# Patient Record
Sex: Female | Born: 1995 | Race: Black or African American | Hispanic: No | Marital: Single | Smoking: Never smoker
Health system: Southern US, Community
[De-identification: ages and names within clinical notes are randomized; demographics above are authoritative.]

## PROBLEM LIST (undated history)

## (undated) ENCOUNTER — Inpatient Hospital Stay (HOSPITAL_COMMUNITY): Payer: Self-pay

## (undated) ENCOUNTER — Emergency Department (HOSPITAL_COMMUNITY)

## (undated) DIAGNOSIS — B009 Herpesviral infection, unspecified: Secondary | ICD-10-CM

## (undated) DIAGNOSIS — O24419 Gestational diabetes mellitus in pregnancy, unspecified control: Secondary | ICD-10-CM

## (undated) HISTORY — PX: WISDOM TOOTH EXTRACTION: SHX21

## (undated) HISTORY — DX: Herpesviral infection, unspecified: B00.9

## (undated) HISTORY — DX: Gestational diabetes mellitus in pregnancy, unspecified control: O24.419

---

## 2016-09-01 ENCOUNTER — Emergency Department (HOSPITAL_COMMUNITY): Payer: Medicare Other

## 2016-09-01 ENCOUNTER — Encounter (HOSPITAL_COMMUNITY): Payer: Self-pay | Admitting: Emergency Medicine

## 2016-09-01 ENCOUNTER — Emergency Department (HOSPITAL_COMMUNITY)
Admission: EM | Admit: 2016-09-01 | Discharge: 2016-09-01 | Disposition: A | Discharge status: Home / Self Care | Payer: Medicare Other | Attending: Emergency Medicine | Admitting: Emergency Medicine

## 2016-09-01 DIAGNOSIS — F17200 Nicotine dependence, unspecified, uncomplicated: Secondary | ICD-10-CM | POA: Insufficient documentation

## 2016-09-01 DIAGNOSIS — R0789 Other chest pain: Secondary | ICD-10-CM | POA: Diagnosis not present

## 2016-09-01 DIAGNOSIS — R072 Precordial pain: Secondary | ICD-10-CM | POA: Diagnosis present

## 2016-09-01 LAB — BASIC METABOLIC PANEL
Anion gap: 9 (ref 5–15)
BUN: 12 mg/dL (ref 6–20)
CO2: 24 mmol/L (ref 22–32)
Calcium: 9.3 mg/dL (ref 8.9–10.3)
Chloride: 105 mmol/L (ref 101–111)
Creatinine, Ser: 0.93 mg/dL (ref 0.44–1.00)
GFR calc Af Amer: >60 mL/min (ref >60)
GFR calc non Af Amer: >60 mL/min (ref >60)
GLUCOSE: 82 mg/dL (ref 65–99)
POTASSIUM: 3.5 mmol/L (ref 3.5–5.1)
Sodium: 138 mmol/L (ref 135–145)

## 2016-09-01 LAB — CBC
HEMATOCRIT: 40.5 % (ref 36.0–46.0)
Hemoglobin: 13.4 g/dL (ref 12.0–15.0)
MCH: 26.7 pg (ref 26.0–34.0)
MCHC: 33.1 g/dL (ref 30.0–36.0)
MCV: 80.8 fL (ref 78.0–100.0)
Platelets: 296 10*3/uL (ref 150–400)
RBC: 5.01 MIL/uL (ref 3.87–5.11)
RDW: 13.3 % (ref 11.5–15.5)
WBC: 11.2 10*3/uL — ABNORMAL HIGH (ref 4.0–10.5)

## 2016-09-01 LAB — I-STAT TROPONIN, ED: Troponin i, poc: 0 ng/mL (ref 0.00–0.08)

## 2016-09-01 MED ORDER — GI COCKTAIL ~~LOC~~
30.0000 mL | Freq: Once | ORAL | Status: AC
  Administered 2016-09-01: 30 mL via ORAL
  Filled 2016-09-01: qty 30

## 2016-09-01 NOTE — ED Notes (Signed)
ED Provider at bedside. 

## 2016-09-01 NOTE — ED Triage Notes (Signed)
Pt states "two days ago I was smoking a black and mild and I was drinking, I threw up because I was intoxicated, and now i've been having chest pain since the other day"

## 2016-09-01 NOTE — ED Provider Notes (Addendum)
[facility] Provider Note   CSN: [id number] Arrival date & time: 09/01/16  1212     History   Chief Complaint Chief Complaint  Patient presents with  . Chest Pain    HPI Yolanda Payne is a 20 y.o. female.  The history is provided by the patient.  Chest Pain   This is a new problem. The current episode started 2 days ago. The problem occurs daily. Progression since onset: intermittent. Associated with: etoh and smoking cigar. The pain is present in the substernal region. The pain is mild. The quality of the pain is described as dull. The pain does not radiate. Associated symptoms include vomiting (2 days ago after etoh use; not since). Pertinent negatives include no abdominal pain, no back pain, no cough, no diaphoresis, no fever, no hemoptysis, no malaise/fatigue, no nausea, no palpitations, no shortness of breath and no sputum production.  Pertinent negatives for past medical history include no CAD, no diabetes, no hyperlipidemia, no hypertension, no MI, no PE and no seizures.  Pertinent negatives for family medical history include: no early MI.    History reviewed. No pertinent past medical history.  There are no active problems to display for this patient.   History reviewed. No pertinent surgical history.  OB History    No data available       Home Medications    Prior to Admission medications   Not on File    Family History No family history on file.  Social History Social History  Substance Use Topics  . Smoking status: Current Some Day Smoker  . Smokeless tobacco: Not on file  . Alcohol use Yes     Allergies   Patient has no known allergies.   Review of Systems Review of Systems  Constitutional: Negative for chills, diaphoresis, fever and malaise/fatigue.  HENT: Negative for ear pain and sore throat.   Eyes: Negative for pain and visual disturbance.  Respiratory: Negative for cough, hemoptysis, sputum production and shortness of  breath.   Cardiovascular: Positive for chest pain. Negative for palpitations.  Gastrointestinal: Positive for vomiting (2 days ago after etoh use; not since). Negative for abdominal pain and nausea.  Genitourinary: Negative for dysuria and hematuria.  Musculoskeletal: Negative for arthralgias and back pain.  Skin: Negative for color change and rash.  Neurological: Negative for seizures and syncope.  All other systems reviewed and are negative.    Physical Exam Updated Vital Signs BP 105/74   Pulse 67   Temp 98.3 F (36.8 C) (Oral)   Resp 23   Ht 5\' 3"  (1.6 m)   Wt 196 lb (88.9 kg)   LMP 08/01/2016 (Approximate)   SpO2 96%   BMI 34.72 kg/m   Physical Exam  Constitutional: She is oriented to person, place, and time. She appears well-developed and well-nourished. No distress.  HENT:  Head: Normocephalic and atraumatic.  Nose: Nose normal.  Eyes: Conjunctivae and EOM are normal. Pupils are equal, round, and reactive to light. Right eye exhibits no discharge. Left eye exhibits no discharge. No scleral icterus.  Neck: Normal range of motion. Neck supple.  Cardiovascular: Normal rate and regular rhythm.  Exam reveals no gallop and no friction rub.   No murmur heard. Pulmonary/Chest: Effort normal and breath sounds normal. No stridor. No respiratory distress. She has no rales.  Abdominal: Soft. She exhibits no distension. There is no tenderness.  Musculoskeletal: She exhibits no edema or tenderness.  Neurological: She is alert and oriented to person, place, and  time.  Skin: Skin is warm and dry. No rash noted. She is not diaphoretic. No erythema.  Psychiatric: She has a normal mood and affect.  Vitals reviewed.    ED Treatments / Results  Labs (all labs ordered are listed, but only abnormal results are displayed) Labs Reviewed  CBC - Abnormal; Notable for the following:       Result Value   WBC 11.2 (*)    All other components within normal limits  BASIC METABOLIC PANEL    I-STAT TROPOININ, ED    EKG  EKG Interpretation  Date/Time:  Saturday September 01 2016 12:36:29 EST Ventricular Rate:  81 PR Interval:  132 QRS Duration: 82 QT Interval:  390 QTC Calculation: 453 R Axis:   61 Text Interpretation:  Normal sinus rhythm with sinus arrhythmia Normal ECG No old tracing to compare Confirmed by [facility] MD, Georgina Krist [id number]) on 09/01/2016 5:33:38 PM       Radiology Dg Chest 2 View  Result Date: 09/01/2016 CLINICAL DATA:  Intermittent central chest pain 2 days. EXAM: CHEST  2 VIEW COMPARISON:  None. FINDINGS: Lungs are clear. Cardiothymic silhouette, bones and soft tissues are within normal. IMPRESSION: No active cardiopulmonary disease. Electronically Signed   By: Elberta Fortisaniel  Boyle M.D.   On: 09/01/2016 13:39    Procedures Procedures (including critical care time)  Medications Ordered in ED Medications  gi cocktail (Maalox,Lidocaine,Donnatal) (30 mLs Oral Given 09/01/16 1748)     Initial Impression / Assessment and Plan / ED Course  I have reviewed the triage vital signs and the nursing notes.  Pertinent labs & imaging results that were available during my care of the patient were reviewed by me and considered in my medical decision making (see chart for details).  Clinical Course as of Sep 02 1903  Sat Sep 01, 2016  1739 Atypical chest pain which is highly inconsistent with ACS. Most likely secondary to esophagitis due to alcohol consumption and emesis. Chest x-ray negative for chest pain. No evidence suggesting esophageal perforation. EKG: Normal sinus rhythm, intervals, axis. No evidence of acute ischemia, arrhythmias, or blocks. Triage labs obtained including troponin grossly reassuring. Do not feel that additional cardiac workup is indicated at this time. Low suspicion for pulmonary embolism. Presentation not classic for aortic dissection.  We'll provide the patient with GI cocktail.  [PC]  1904 Some improvement in pain.  The patient is safe for  discharge with strict return precautions.   [PC]    Clinical Course User Index [PC] Nira ConnPedro Eduardo Jaimee Corum, MD      Final Clinical Impressions(s) / ED Diagnoses   Final diagnoses:  Atypical chest pain   Disposition: Discharge  Condition: Good  I have discussed the results, Dx and Tx plan with the patient who expressed understanding and agree(s) with the plan. Discharge instructions discussed at great length. The patient was given strict return precautions who verbalized understanding of the instructions. No further questions at time of discharge.    New Prescriptions   No medications on file    Follow Up: primary care provider  Schedule an appointment as soon as possible for a visit  As needed      Nira ConnPedro Eduardo Jamonta Goerner, MD 09/01/16 1905    Nira ConnPedro Eduardo Carine Nordgren, MD 09/26/16 1531

## 2016-09-07 ENCOUNTER — Encounter (HOSPITAL_COMMUNITY): Payer: Self-pay | Admitting: Emergency Medicine

## 2016-09-07 ENCOUNTER — Emergency Department (HOSPITAL_COMMUNITY)
Admission: EM | Admit: 2016-09-07 | Discharge: 2016-09-07 | Disposition: A | Discharge status: Home / Self Care | Payer: Medicare Other | Attending: Emergency Medicine | Admitting: Emergency Medicine

## 2016-09-07 DIAGNOSIS — J029 Acute pharyngitis, unspecified: Secondary | ICD-10-CM

## 2016-09-07 DIAGNOSIS — F17200 Nicotine dependence, unspecified, uncomplicated: Secondary | ICD-10-CM | POA: Insufficient documentation

## 2016-09-07 DIAGNOSIS — J069 Acute upper respiratory infection, unspecified: Secondary | ICD-10-CM | POA: Diagnosis not present

## 2016-09-07 LAB — RAPID STREP SCREEN (MED CTR MEBANE ONLY): STREPTOCOCCUS, GROUP A SCREEN (DIRECT): NEGATIVE

## 2016-09-07 MED ORDER — BENZONATATE 100 MG PO CAPS: 100.0000 mg | ORAL_CAPSULE | Freq: Three times a day (TID) | ORAL | 0 refills | Status: DC

## 2016-09-07 MED ORDER — NAPROXEN 500 MG PO TABS: 500.0000 mg | ORAL_TABLET | Freq: Two times a day (BID) | ORAL | 0 refills | Status: DC

## 2016-09-07 MED ORDER — PREDNISONE 20 MG PO TABS
60.0000 mg | ORAL_TABLET | Freq: Once | ORAL | Status: AC
  Administered 2016-09-07: 60 mg via ORAL
  Filled 2016-09-07: qty 3

## 2016-09-07 NOTE — ED Provider Notes (Signed)
[facility] Provider Note   CSN: [id number] Arrival date & time: 09/07/16  0133   By signing my name below, I, Clovis PuAvnee Patel, attest that this documentation has been prepared under the direction and in the presence of Gilda Creasehristopher J Hollee Fate, MD  Electronically Signed: Clovis PuAvnee Patel, ED Scribe. 09/07/16. 2:37 AM.   History   Chief Complaint Chief Complaint  Patient presents with  . Sore Throat   The history is provided by the patient. No language interpreter was used.   HPI Comments:  Yolanda Payne is a 20 y.o. female who presents to the Emergency Department complaining of sudden onset, persistent sore throat x 2 days. Pt also reports nasal congestion, rhinorrhea, sinus pain and a cough. No alleviating factors noted. Pt denies any sick contacts, any other associated symptoms and modifying factors at this time.    History reviewed. No pertinent past medical history.  There are no active problems to display for this patient.   History reviewed. No pertinent surgical history.  OB History    No data available       Home Medications    Prior to Admission medications   Medication Sig Start Date End Date Taking? Authorizing Provider  benzonatate (TESSALON) 100 MG capsule Take 1 capsule (100 mg total) by mouth every 8 (eight) hours. 09/07/16   Gilda Creasehristopher J Kysha Muralles, MD  naproxen (NAPROSYN) 500 MG tablet Take 1 tablet (500 mg total) by mouth 2 (two) times daily. 09/07/16   Gilda Creasehristopher J Berdella Bacot, MD    Family History No family history on file.  Social History Social History  Substance Use Topics  . Smoking status: Current Some Day Smoker  . Smokeless tobacco: Never Used  . Alcohol use Yes     Allergies   Patient has no known allergies.   Review of Systems Review of Systems  HENT: Positive for congestion, rhinorrhea, sinus pain and sore throat.   Respiratory: Positive for cough.   All other systems reviewed and are negative.  Physical Exam Updated  Vital Signs BP 130/71 (BP Location: Left Arm)   Pulse 112   Temp 98.6 F (37 C) (Oral)   Resp 18   Wt 196 lb (88.9 kg)   LMP 08/19/2016 (Approximate)   SpO2 99%   BMI 34.72 kg/m   Physical Exam  Constitutional: She is oriented to person, place, and time. She appears well-developed and well-nourished. No distress.  HENT:  Head: Normocephalic and atraumatic.  Right Ear: Hearing normal.  Left Ear: Hearing normal.  Nose: Nose normal.  Mouth/Throat: Oropharynx is clear and moist and mucous membranes are normal.  Maxillary sinus tenderness   Eyes: Conjunctivae and EOM are normal. Pupils are equal, round, and reactive to light.  Neck: Normal range of motion. Neck supple.  Cardiovascular: Regular rhythm, S1 normal and S2 normal.  Exam reveals no gallop and no friction rub.   No murmur heard. Pulmonary/Chest: Effort normal and breath sounds normal. No respiratory distress. She exhibits no tenderness.  Abdominal: Soft. Normal appearance and bowel sounds are normal. There is no hepatosplenomegaly. There is no tenderness. There is no rebound, no guarding, no tenderness at McBurney's point and negative Murphy's sign. No hernia.  Musculoskeletal: Normal range of motion.  Neurological: She is alert and oriented to person, place, and time. She has normal strength. No cranial nerve deficit or sensory deficit. Coordination normal. GCS eye subscore is 4. GCS verbal subscore is 5. GCS motor subscore is 6.  Skin: Skin is warm, dry and intact. No  rash noted. No cyanosis.  Psychiatric: She has a normal mood and affect. Her speech is normal and behavior is normal. Thought content normal.  Nursing note and vitals reviewed.    ED Treatments / Results  DIAGNOSTIC STUDIES:  Oxygen Saturation is 99% on RA, normal by my interpretation.    COORDINATION OF CARE:  2:35 AM Discussed treatment plan with pt at bedside and pt agreed to plan.  Labs (all labs ordered are listed, but only abnormal results are  displayed) Labs Reviewed  RAPID STRE[facility]EN [facility])  CULTURE, GROUP A STREP [facility])    EKG  EKG Interpretation None       Radiology No results found.  Procedures Procedures (including critical care time)  Medications Ordered in ED Medications  predniSONE (DELTASONE) tablet 60 mg (not administered)     Initial Impression / Assessment and Plan / ED Course  I have reviewed the triage vital signs and the nursing notes.  Pertinent labs & imaging results that were available during my care of the patient were reviewed by me and considered in my medical decision making (see chart for details).  Clinical Course   Sore throat with cold symptoms, negative strep. Treat symptomatically.  Final Clinical Impressions(s) / ED Diagnoses   Final diagnoses:  Viral pharyngitis  Upper respiratory tract infection, unspecified type    New Prescriptions New Prescriptions   BENZONATATE (TESSALON) 100 MG CAPSULE    Take 1 capsule (100 mg total) by mouth every 8 (eight) hours.   NAPROXEN (NAPROSYN) 500 MG TABLET    Take 1 tablet (500 mg total) by mouth 2 (two) times daily.  I personally performed the services described in this documentation, which was scribed in my presence. The recorded information has been reviewed and is accurate.     Gilda Creasehristopher J Reesa Gotschall, MD 09/07/16 [phone]

## 2016-09-07 NOTE — ED Notes (Signed)
Pt presents with multiple complaints, from cold symptoms (runny nose, congestion, non-productive cough, and sore throat) to a "possible yeast infection and pain down there." Pt reports using monistat.

## 2016-09-07 NOTE — ED Triage Notes (Signed)
Patient reports sore throat with swelling , occasional dry cough and nasal congestion onset this week , denies fever or chills.

## 2016-09-07 NOTE — ED Notes (Signed)
Patient verbalized understanding of discharge instructions and denies any further needs or questions at this time. VS stable. Patient ambulatory with steady gait.  

## 2016-09-09 LAB — CULTURE, GROUP A STREP (THRC)

## 2016-10-30 ENCOUNTER — Ambulatory Visit (HOSPITAL_COMMUNITY)
Admission: EM | Admit: 2016-10-30 | Discharge: 2016-10-30 | Disposition: A | Discharge status: Home / Self Care | Payer: Medicare Other | Attending: Internal Medicine | Admitting: Internal Medicine

## 2016-10-30 ENCOUNTER — Encounter (HOSPITAL_COMMUNITY): Payer: Self-pay | Admitting: Family Medicine

## 2016-10-30 DIAGNOSIS — J020 Streptococcal pharyngitis: Secondary | ICD-10-CM

## 2016-10-30 DIAGNOSIS — K115 Sialolithiasis: Secondary | ICD-10-CM | POA: Diagnosis not present

## 2016-10-30 LAB — POCT RAPID STREP A: STREPTOCOCCUS, GROUP A SCREEN (DIRECT): POSITIVE — AB

## 2016-10-30 MED ORDER — PENICILLIN V POTASSIUM 500 MG PO TABS: 500.0000 mg | ORAL_TABLET | Freq: Two times a day (BID) | ORAL | 0 refills | Status: AC

## 2016-10-30 NOTE — ED Triage Notes (Signed)
Pt here for swollen tonsils and white patches.

## 2016-10-30 NOTE — ED Provider Notes (Addendum)
[facility]    CSN: [id number] Arrival date & time: 10/30/16  1501     History   Chief Complaint Chief Complaint  Patient presents with  . Sore Throat    HPI Yolanda Payne is a 21 y.o. female.   Patient complains of a white "stone" in her tonsils that hurts.  She denies fevers, N/V/D.  I has been there x4 days.      History reviewed. No pertinent past medical history.  There are no active problems to display for this patient.   History reviewed. No pertinent surgical history.  OB History    No data available       Home Medications    Prior to Admission medications   Medication Sig Start Date End Date Taking? Authorizing Provider  benzonatate (TESSALON) 100 MG capsule Take 1 capsule (100 mg total) by mouth every 8 (eight) hours. 09/07/16   Gilda Creasehristopher J Pollina, MD  naproxen (NAPROSYN) 500 MG tablet Take 1 tablet (500 mg total) by mouth 2 (two) times daily. 09/07/16   Gilda Creasehristopher J Pollina, MD    Family History History reviewed. No pertinent family history.  Social History Social History  Substance Use Topics  . Smoking status: Current Some Day Smoker  . Smokeless tobacco: Never Used  . Alcohol use Yes     Allergies   Patient has no known allergies.   Review of Systems Review of Systems  Constitutional: Negative for chills and fever.  HENT: Positive for sore throat. Negative for ear pain.   Eyes: Negative for pain and visual disturbance.  Respiratory: Negative for cough and shortness of breath.   Cardiovascular: Negative for chest pain and palpitations.  Gastrointestinal: Negative for abdominal pain and vomiting.  Genitourinary: Negative for dysuria and hematuria.  Musculoskeletal: Negative for arthralgias and back pain.  Skin: Negative for color change and rash.  Neurological: Negative for seizures and syncope.  All other systems reviewed and are negative.    Physical Exam Triage Vital Signs ED Triage Vitals  Enc Vitals  Group     BP 10/30/16 1536 110/64     Pulse Rate 10/30/16 1536 69     Resp 10/30/16 1536 18     Temp 10/30/16 1536 98.5 F (36.9 C)     Temp src --      SpO2 10/30/16 1536 100 %     Weight --      Height --      Head Circumference --      Peak Flow --      Pain Score 10/30/16 1538 10     Pain Loc --      Pain Edu? --      Excl. in GC? --    No data found.   Updated Vital Signs BP 110/64   Pulse 69   Temp 98.5 F (36.9 C)   Resp 18   LMP 10/10/2016   SpO2 100%   Visual Acuity Right Eye Distance:   Left Eye Distance:   Bilateral Distance:    Right Eye Near:   Left Eye Near:    Bilateral Near:     Physical Exam  Constitutional: She is oriented to person, place, and time. She appears well-developed and well-nourished. No distress.  HENT:  Head: Normocephalic and atraumatic.  Mouth/Throat: Oropharynx is clear and moist.  Solid white exudative-appearing substance lodged behind left tonsil  Eyes: Conjunctivae and EOM are normal. Pupils are equal, round, and reactive to light. No scleral icterus.  Neck: Normal range of motion. Neck supple. No JVD present. No tracheal deviation present. No thyromegaly present.  Cardiovascular: Normal rate, regular rhythm and normal heart sounds.  Exam reveals no gallop and no friction rub.   No murmur heard. Pulmonary/Chest: Effort normal and breath sounds normal.  Abdominal: Soft. Bowel sounds are normal. She exhibits no distension. There is no tenderness.  Musculoskeletal: Normal range of motion. She exhibits no edema.  Lymphadenopathy:    She has no cervical adenopathy.  Neurological: She is alert and oriented to person, place, and time. No cranial nerve deficit.  Skin: Skin is warm and dry.  Psychiatric: She has a normal mood and affect. Her behavior is normal. Judgment and thought content normal.  Nursing note and vitals reviewed.    UC Treatments / Results  Labs (all labs ordered are listed, but only abnormal results are  displayed) Labs Reviewed  POCT RAPID STREP A - Abnormal; Notable for the following:       Result Value   Streptococcus, Group A Screen (Direct) POSITIVE (*)    All other components within normal limits    EKG  EKG Interpretation None       Radiology No results found.  Procedures Procedures (including critical care time)  Medications Ordered in UC Medications - No data to display   Initial Impression / Assessment and Plan / UC Course  I have reviewed the triage vital signs and the nursing notes.  Pertinent labs & imaging results that were available during my care of the patient were reviewed by me and considered in my medical decision making (see chart for details).     Attempted to dislodge with moderate success.  No complications.  Advised gargling or using water-pic to dislodge macerated calcified food matter.  May suck on a lemon to stimulate smooth muscle contraction within oropharynx.  Prior to discharge discovered the patient is positive for strep throat.  She has no symptoms of strep infection at this time but she has young siblings who may have been exposed at school. Patient may be a carrier.  Final Clinical Impressions(s) / UC Diagnoses   Final diagnoses:  Sialolith    New Prescriptions New Prescriptions   No medications on file     Arnaldo Natal, MD 10/30/16 1723    Arnaldo Natal, MD 10/30/16 [phone]

## 2016-12-30 ENCOUNTER — Emergency Department (HOSPITAL_COMMUNITY)
Admission: EM | Admit: 2016-12-30 | Discharge: 2016-12-30 | Disposition: A | Discharge status: Home / Self Care | Payer: Medicare Other | Attending: Emergency Medicine | Admitting: Emergency Medicine

## 2016-12-30 ENCOUNTER — Encounter (HOSPITAL_COMMUNITY): Payer: Self-pay | Admitting: *Deleted

## 2016-12-30 DIAGNOSIS — F17200 Nicotine dependence, unspecified, uncomplicated: Secondary | ICD-10-CM | POA: Insufficient documentation

## 2016-12-30 DIAGNOSIS — N390 Urinary tract infection, site not specified: Secondary | ICD-10-CM | POA: Diagnosis not present

## 2016-12-30 DIAGNOSIS — R350 Frequency of micturition: Secondary | ICD-10-CM | POA: Diagnosis present

## 2016-12-30 LAB — URINALYSIS, ROUTINE W REFLEX MICROSCOPIC
BILIRUBIN URINE: NEGATIVE
Bacteria, UA: NONE SEEN
GLUCOSE, UA: 50 mg/dL — AB
KETONES UR: NEGATIVE mg/dL
NITRITE: NEGATIVE
PROTEIN: 100 mg/dL — AB
Specific Gravity, Urine: 1.023 (ref 1.005–1.030)
Squamous Epithelial / HPF: NONE SEEN
pH: 5 (ref 5.0–8.0)

## 2016-12-30 LAB — POC URINE PREG, ED: PREG TEST UR: NEGATIVE

## 2016-12-30 MED ORDER — CEPHALEXIN 500 MG PO CAPS: 500.0000 mg | ORAL_CAPSULE | Freq: Four times a day (QID) | ORAL | 0 refills | Status: DC

## 2016-12-30 MED ORDER — CEPHALEXIN 250 MG PO CAPS
500.0000 mg | ORAL_CAPSULE | Freq: Once | ORAL | Status: AC
  Administered 2016-12-30: 500 mg via ORAL
  Filled 2016-12-30: qty 2

## 2016-12-30 NOTE — ED Triage Notes (Signed)
Pt reports lower abd pressure and burning pain when she urinates. Having urinary frequency this morning. Denies vaginal discharge.

## 2016-12-30 NOTE — Discharge Instructions (Signed)
You have a urinary tract infection. Increase fluids. Prescription for antibiotic. Recommend urination after sexual activity.

## 2017-01-01 NOTE — ED Provider Notes (Signed)
[facility]   CSN: [id number] Arrival date & time: 12/30/16  1224     History   Chief Complaint Chief Complaint  Patient presents with  . Urinary Frequency    HPI Yolanda Payne is a 21 y.o. female.  Patient presents with suprapubic discomfort, dysuria, frequency, voiding small amounts. No fever, sweats, chills, flank pain, vaginal discharge. She is normally healthy. She is sexually active. No previous pregnancies. She uses condoms.      History reviewed. No pertinent past medical history.  There are no active problems to display for this patient.   History reviewed. No pertinent surgical history.  OB History    No data available       Home Medications    Prior to Admission medications   Medication Sig Start Date End Date Taking? Authorizing Provider  ibuprofen (ADVIL,MOTRIN) 200 MG tablet Take 200 mg by mouth every 6 (six) hours as needed for headache (pain).   Yes Historical Provider, MD  Phenazopyridine HCl (AZO-STANDARD PO) Take 1 tablet by mouth once. For urinary tract pain   Yes Historical Provider, MD  benzonatate (TESSALON) 100 MG capsule Take 1 capsule (100 mg total) by mouth every 8 (eight) hours. Patient not taking: Reported on 12/30/2016 09/07/16   Gilda Crease, MD  cephALEXin (KEFLEX) 500 MG capsule Take 1 capsule (500 mg total) by mouth 4 (four) times daily. 12/30/16   Donnetta Hutching, MD  naproxen (NAPROSYN) 500 MG tablet Take 1 tablet (500 mg total) by mouth 2 (two) times daily. Patient not taking: Reported on 12/30/2016 09/07/16   Gilda Crease, MD    Family History History reviewed. No pertinent family history.  Social History Social History  Substance Use Topics  . Smoking status: Current Some Day Smoker  . Smokeless tobacco: Never Used  . Alcohol use Yes     Allergies   Patient has no known allergies.   Review of Systems Review of Systems  All other systems reviewed and are  negative.    Physical Exam Updated Vital Signs BP 105/69 (BP Location: Right Arm)   Pulse 70   Temp 98.4 F (36.9 C) (Oral)   Resp 14   SpO2 99%   Physical Exam  Constitutional: She is oriented to person, place, and time. She appears well-developed and well-nourished.  HENT:  Head: Normocephalic and atraumatic.  Eyes: Conjunctivae are normal.  Neck: Neck supple.  Cardiovascular: Normal rate and regular rhythm.   Pulmonary/Chest: Effort normal and breath sounds normal.  Abdominal:  Minimal suprapubic tenderness.  Musculoskeletal: Normal range of motion.  Neurological: She is alert and oriented to person, place, and time.  Skin: Skin is warm and dry.  Psychiatric: She has a normal mood and affect. Her behavior is normal.  Nursing note and vitals reviewed.    ED Treatments / Results  Labs (all labs ordered are listed, but only abnormal results are displayed) Labs Reviewed  URINALYSIS, ROUTINE W REFLEX MICROSCOPIC - Abnormal; Notable for the following:       Result Value   Color, Urine AMBER (*)    APPearance TURBID (*)    Glucose, UA 50 (*)    Hgb urine dipstick LARGE (*)    Protein, ur 100 (*)    Leukocytes, UA MODERATE (*)    All other components within normal limits  POC URINE PREG, ED    EKG  EKG Interpretation None       Radiology No results found.  Procedures Procedures (including critical  care time)  Medications Ordered in ED Medications  cephALEXin (KEFLEX) capsule 500 mg (500 mg Oral Given 12/30/16 1442)     Initial Impression / Assessment and Plan / ED Course  I have reviewed the triage vital signs and the nursing notes.  Pertinent labs & imaging results that were available during my care of the patient were reviewed by me and considered in my medical decision making (see chart for details).     Patient is stable. History of physical most consistent with uncomplicated urinary tract infection. Will Rx Keflex.  Final Clinical  Impressions(s) / ED Diagnoses   Final diagnoses:  Urinary tract infection without hematuria, site unspecified    New Prescriptions Discharge Medication List as of 12/30/2016  4:19 PM    START taking these medications   Details  cephALEXin (KEFLEX) 500 MG capsule Take 1 capsule (500 mg total) by mouth 4 (four) times daily., Starting Sun 12/30/2016, Print         Donnetta Hutching, MD 01/01/17 1248

## 2017-02-25 ENCOUNTER — Encounter (HOSPITAL_COMMUNITY): Payer: Self-pay

## 2017-02-25 ENCOUNTER — Emergency Department (HOSPITAL_COMMUNITY)
Admission: EM | Admit: 2017-02-25 | Discharge: 2017-02-25 | Disposition: A | Discharge status: Home / Self Care | Payer: Medicare Other | Attending: Emergency Medicine | Admitting: Emergency Medicine

## 2017-02-25 DIAGNOSIS — Z5321 Procedure and treatment not carried out due to patient leaving prior to being seen by health care provider: Secondary | ICD-10-CM | POA: Insufficient documentation

## 2017-02-25 DIAGNOSIS — N938 Other specified abnormal uterine and vaginal bleeding: Secondary | ICD-10-CM | POA: Diagnosis not present

## 2017-02-25 LAB — URINALYSIS, ROUTINE W REFLEX MICROSCOPIC
Bilirubin Urine: NEGATIVE
GLUCOSE, UA: NEGATIVE mg/dL
Ketones, ur: NEGATIVE mg/dL
Leukocytes, UA: NEGATIVE
Nitrite: NEGATIVE
Protein, ur: 30 mg/dL — AB
SPECIFIC GRAVITY, URINE: 1.027 (ref 1.005–1.030)
pH: 5 (ref 5.0–8.0)

## 2017-02-25 LAB — POC URINE PREG, ED: Preg Test, Ur: NEGATIVE

## 2017-02-25 NOTE — ED Notes (Signed)
Patient called with no response.

## 2017-02-25 NOTE — ED Triage Notes (Signed)
Pt reports she started her menstrual cycle yesterday and felt like she was going to pass out. She reports heavy bleeding and lower abdominal cramping.

## 2018-05-22 ENCOUNTER — Other Ambulatory Visit: Payer: Self-pay

## 2018-05-22 ENCOUNTER — Ambulatory Visit (HOSPITAL_BASED_OUTPATIENT_CLINIC_OR_DEPARTMENT_OTHER): Payer: Medicare Other | Admitting: Family Medicine

## 2018-05-22 ENCOUNTER — Encounter: Payer: Self-pay | Admitting: Family Medicine

## 2018-05-22 ENCOUNTER — Other Ambulatory Visit (HOSPITAL_COMMUNITY)
Admission: RE | Admit: 2018-05-22 | Discharge: 2018-05-22 | Disposition: A | Discharge status: Home / Self Care | Payer: Medicare Other | Source: Ambulatory Visit | Attending: Family Medicine | Admitting: Family Medicine

## 2018-05-22 VITALS — BP 116/73 | HR 74 | Temp 98.1°F | Resp 18 | Ht 63.0 in | Wt 206.8 lb

## 2018-05-22 DIAGNOSIS — Z202 Contact with and (suspected) exposure to infections with a predominantly sexual mode of transmission: Secondary | ICD-10-CM

## 2018-05-22 DIAGNOSIS — Z8619 Personal history of other infectious and parasitic diseases: Secondary | ICD-10-CM

## 2018-05-22 DIAGNOSIS — N898 Other specified noninflammatory disorders of vagina: Secondary | ICD-10-CM | POA: Insufficient documentation

## 2018-05-22 DIAGNOSIS — R5383 Other fatigue: Secondary | ICD-10-CM

## 2018-05-22 NOTE — Progress Notes (Signed)
Flu shot: no Pain : yeast infection  Std testing  Patient is currently on her period

## 2018-05-22 NOTE — Patient Instructions (Signed)
Preventing Sexually Transmitted Infections, Adult Sexually transmitted infections (STIs) are diseases that are passed (transmitted) from person to person through bodily fluids exchanged during sex or sexual contact. Bodily fluids include saliva, semen, blood, vaginal mucus, and urine. You may have an increased risk for developing an STI if you have unprotected oral, vaginal, or anal sex. Some common STIs include:  Herpes.  Hepatitis B.  Chlamydia.  Gonorrhea.  Syphilis.  HPV (human papillomavirus).  HIV (humanimmunodeficiency virus), the virus that can cause AIDS (acquired immunodeficiency virus).  How can I protect myself from sexually transmitted infections? The only way to completely prevent STIs is not to have sex of any kind (practice abstinence). This includes oral, vaginal, or anal sex. If you are sexually active, take these actions to lower your risk of getting an STI:  Have only one sex partner (be monogamous) or limit the number of sexual partners you have.  Stay up-to-date on immunizations. Certain vaccines can lower your risk of getting certain STIs, such as: ? Hepatitis A and B vaccines. You may have been vaccinated as a young child, but likely need a booster shot as a teen or young adult. ? HPV vaccine. This vaccine is recommended if you are a man under age 22 or a woman under age 27.  Use methods that prevent the exchange of body fluids between partners (barrier protection) every time you have sex. Barrier protection can be used during oral, vaginal, or anal sex. Commonly used barrier methods include: ? Female condom. ? Female condom. ? Dental dam.  Get tested regularly for STIs. Have your sexual partner get tested regularly as well.  Avoid mixing alcohol, drugs, and sex. Alcohol and drug use can affect your ability to make good decisions and can lead to risky sexual behaviors.  Ask your health care provider about taking pre-exposure prophylaxis (PrEP) to prevent HIV  infection if you: ? Have a HIV-positive sexual partner. ? Have multiple sexual partners or partners who do not know their HIV status, and do not regularly use a condom during sex. ? Use injection drugs and share needles.  Birth control pills, injections, implants, and intrauterine devices (IUDs) do not protect against STIs. To prevent both STIs and pregnancy, always use a condom with another form of birth control. Some STIs, such as herpes, are spread through skin to skin contact. A condom does not protect you from getting such STIs. If you or your partner have herpes and there is an active flare with open sores, avoid all sexual contact. Why are these changes important? Taking steps to practice safe sex protects you and others. Many STIs can be cured. However, some STIs are not curable and will affect you for the rest of your life. STIs can be passed on to another person even if you do not have symptoms. What can happen if changes are not made? Certain STIs may:  Require you to take medicine for the rest of your life.  Affect your ability to have children (your fertility).  Increase your risk for developing another STI or certain serious health conditions, such as: ? Cervical cancer. ? Head and neck cancer. ? Pelvic inflammatory disease (PID) in women. ? Organ damage or damage to other parts of your body, if the infection spreads.  Be passed to a baby during childbirth.  How are sexually transmitted infections treated? If you or your partner know or think that you may have an STI:  Talk with your healthcare provider about what can be   done to treat it. Some STIs can be treated and cured with medicines.  For curable STIs, you and your partner should avoid sex during treatment and for several days after treatment is complete.  You and your partner should both be treated at the same time, if there is any chance that your partner is infected as well. If you get treatment but your partner  does not, your partner can re-infect you when you resume sexual contact.  Do not have unprotected sex.  Where to find more information: Learn more about sexually transmitted diseases and infections from:  Centers for Disease Control and Prevention: ? More information about specific STIs: www.cdc.gov/std ? Find places to get sexual health counseling and treatment for free or for a low cost: gettested.cdc.gov  [place] Department of Health and Human Services: www.womenshealth.gov/publications/our-publications/fact-sheet/sexually-transmitted-infections.html  Summary  The only way to completely prevent STIs is not to have sex (practice abstinence), including oral, vaginal, or anal sex.  STIs can spread through saliva, semen, blood, vaginal mucus, urine, or sexual contact.  If you do have sex, limit your number of sexual partners and use a barrier protection method every time you have sex.  If you develop an STI, get treated right away and ask your partner to be treated as well. Do not resume having sex until both of you have completed treatment for the STI. This information is not intended to replace advice given to you by your health care provider. Make sure you discuss any questions you have with your health care provider. Document Released: 08/23/2016 Document Revised: 08/23/2016 Document Reviewed: 08/23/2016 Elsevier Interactive Patient Education  2018 [employer] Inc[employer]  Safe Sex Practicing safe sex means taking steps before and during sex to reduce your risk of:  Getting an STD (sexually transmitted disease).  Giving your partner an STD.  Unwanted pregnancy.  How can I practice safe sex?  To practice safe sex:  Limit your sexual partners to only one partner who is having sex with only you.  Avoid using alcohol and recreational drugs before having sex. These substances can affect your judgment.  Before having sex with a new partner: ? Talk to your partner about past partners,  past STDs, and drug use. ? You and your partner should be screened for STDs and discuss the results with each other.  Check your body regularly for sores, blisters, rashes, or unusual discharge. If you notice any of these problems, visit your health care provider.  If you have symptoms of an infection or you are being treated for an STD, avoid sexual contact.  While having sex, use a condom. Make sure to: ? Use a condom every time you have vaginal, oral, or anal sex. Both females and males should wear condoms during oral sex. ? Keep condoms in place from the beginning to the end of sexual activity. ? Use a latex condom, if possible. Latex condoms offer the best protection. ? Use only water-based lubricants or oils to lubricate a condom. Using petroleum-based lubricants or oils will weaken the condom and increase the chance that it will break.  See your health care provider for regular screenings, exams, and tests for STDs.  Talk with your health care provider about the form of birth control (contraception) that is best for you.  Get vaccinated against hepatitis B and human papillomavirus (HPV).  If you are at risk of being infected with HIV (human immunodeficiency virus), talk with your health care provider about taking a prescription medicine to prevent HIV   infection. You are considered at risk for HIV if: ? You are a man who has sex with other men. ? You are a heterosexual man or woman who is sexually active with more than one partner. ? You take drugs by injection. ? You are sexually active with a partner who has HIV.  This information is not intended to replace advice given to you by your health care provider. Make sure you discuss any questions you have with your health care provider. Document Released: 10/04/2004 Document Revised: 01/11/2016 Document Reviewed: 07/17/2015 Elsevier Interactive Patient Education  2018 Elsevier Inc.  

## 2018-05-23 LAB — HIV ANTIBODY (ROUTINE TESTING W REFLEX): HIV Screen 4th Generation wRfx: NONREACTIVE

## 2018-05-23 LAB — URINE CYTOLOGY ANCILLARY ONLY
Chlamydia: NEGATIVE
Neisseria Gonorrhea: NEGATIVE
Trichomonas: NEGATIVE

## 2018-05-23 LAB — SYPHILIS: RPR W/REFLEX TO RPR TITER AND TREPONEMAL ANTIBODIES, TRADITIONAL SCREENING AND DIAGNOSIS ALGORITHM: RPR Ser Ql: NONREACTIVE

## 2018-05-24 NOTE — Progress Notes (Signed)
Subjective:    Patient ID: Yolanda Payne, female    DOB: 06/07/1996, 22 y.o.   MRN: [id number]  HPI 22 year old female new to the practice.  Patient states that she feels as if she has having some vaginal irritation off and on.  Patient is not sure about vaginal discharge that she is currently on her period which started on May 18, 2018.  Patient reports history of infection with gonorrhea and chlamydia for which she has been treated in the past patient has also asked trichomonal infection which is been treated.  Patient reports lower abdominal/pelvic discomfort but she is not sure if this is related to her current menses.  Patient has had some mild fatigue.  Patient denies any burning with urination.  No urinary frequency.  No fever or chills.  No nausea and no increased back pain.  Patient reports no significant past medical history previous treatment for STIs.  Patient has family history significant for maternal grandmother with diabetes and asthma.  Patient is currently single, currently unemployed.  Patient denies any tobacco or alcohol use.  Patient denies any past surgeries.    Review of Systems  Constitutional: Positive for fatigue. Negative for chills and fever.  Respiratory: Negative for cough and shortness of breath.   Cardiovascular: Negative for chest pain, palpitations and leg swelling.  Gastrointestinal: Positive for abdominal pain (Patient with some crampy lower abdominal pain). Negative for nausea.  Genitourinary: Positive for vaginal bleeding (Currently on her menses). Negative for dysuria and frequency.  Musculoskeletal: Negative for back pain and joint swelling.  Neurological: Negative for dizziness and headaches.       Objective:   Physical Exam  BP 116/73   Pulse 74   Temp 98.1 F (36.7 C) (Oral)   Resp 18   Ht 5\' 3"  (1.6 m)   Wt 206 lb 12.8 oz (93.8 kg)   LMP 05/18/2018 (Exact Date)   SpO2 99%   BMI 36.63 kg/m  Vital signs and nurse's note  reviewed General-well-nourished, well-developed young adult female Lungs-clear to auscultation bilaterally. Cardiovascular-regular rate and rhythm Abdomen-soft, nontender. Back-no CVA tenderness Extremities-no edema     Assessment & Plan:  1. Vaginal irritation Patient with complaint of vaginal irritation, she is unsure if she has had vaginal discharge and she is also currently having her menses.  Patient does have past medical history significant for gonorrhea, chlamydia and trichomonas.  Patient has had recent unprotected sex.  Patient wishes to defer pelvic exam due to her current menses.  Patient will have urine test for STIs and will have blood test for HIV and syphilis at today's visit.  Patient will be notified of the results and if any further treatment is needed based on these results. - Urine cytology ancillary only - HIV antibody (with reflex) - RPR  2. Fatigue, unspecified type Patient will with complaint of fatigue in addition to unprotected sexual practices with possible exposure to STIs.  Patient will be testing at today's visit along with testing for syphilis - HIV antibody (with reflex)  3. H/O gonorrhea Patient with history of gonorrhea, chlamydia and trichomonas.  Patient will have urine test for possible STIs - HIV antibody (with reflex) -Urine cytology ancillary only  4. Exposure to STD Patient with possible exposure to STDs/STIs due to unprotected sexual activity/high risk sexual behavior.  Patient will have testing for HIV, syphilis through blood testing and will have urine test for other STIs including gonorrhea, chlamydia, trichomonas and bacterial vaginitis.  Agricultural engineer as  part of AVS was  given on safe sexual practices and reducing sexually transmitted diseases  - HIV antibody (with reflex) -Urine cytology ancillary only  An After Visit Summary was printed and given to the patient.  Return for 2-3 weeks if continued symptoms.

## 2018-05-26 ENCOUNTER — Telehealth: Payer: Self-pay

## 2018-05-26 NOTE — Telephone Encounter (Signed)
-----   Message from Cain Saupeammie Fulp, MD sent at 05/24/2018  4:46 PM EDT ----- Please notify patient that urine testing was negative for gonorrhea, chlamydia and trichomonas

## 2018-05-26 NOTE — Telephone Encounter (Signed)
Patient was called, answered, verified dob and was given most recent lab results. Patient verbalized understanding and had no further questions.  

## 2018-05-27 ENCOUNTER — Other Ambulatory Visit: Payer: Self-pay | Admitting: Family Medicine

## 2018-05-27 DIAGNOSIS — B9689 Other specified bacterial agents as the cause of diseases classified elsewhere: Secondary | ICD-10-CM

## 2018-05-27 DIAGNOSIS — N760 Acute vaginitis: Principal | ICD-10-CM

## 2018-05-27 LAB — URINE CYTOLOGY ANCILLARY ONLY
Bacterial vaginitis: POSITIVE — AB
Candida vaginitis: NEGATIVE

## 2018-05-27 MED ORDER — METRONIDAZOLE 500 MG PO TABS: 500.0000 mg | ORAL_TABLET | Freq: Two times a day (BID) | ORAL | 0 refills | Status: AC

## 2018-05-27 NOTE — Progress Notes (Signed)
Patient with abnormal urine cytology report consistent with bacterial vaginitis and new prescription sent to patient's pharmacy for metronidazole. CMA will notify patient of the results and medication order.

## 2018-05-28 ENCOUNTER — Telehealth: Payer: Self-pay

## 2018-05-28 NOTE — Telephone Encounter (Signed)
-----   Message from Cain Saupeammie Fulp, MD sent at 05/27/2018  5:09 PM EDT ----- Patient with bacterial vaginitis- overgrowth of normal bacteria in the vaginal area. A prescription will be sent to her pharmacy for metronidazole 500 mg twice daily for 7 days to treat this.

## 2018-05-28 NOTE — Telephone Encounter (Signed)
Pt called to request a nurse call back, in regards to her new lab results. Please follow up

## 2018-05-28 NOTE — Telephone Encounter (Signed)
Patient was called, answered, verified dob, and was given most recent lab results. Patient verbalized understanding and had no further questions at this time.

## 2018-06-09 ENCOUNTER — Ambulatory Visit: Payer: Medicare Other | Admitting: Family Medicine

## 2018-10-22 IMAGING — DX DG CHEST 2V
2 series · 2 of 2 positions shown · non-contrast
Comparison: None.

CLINICAL DATA: Intermittent central chest pain 2 days.

EXAM:
CHEST  2 VIEW

[chest pa]
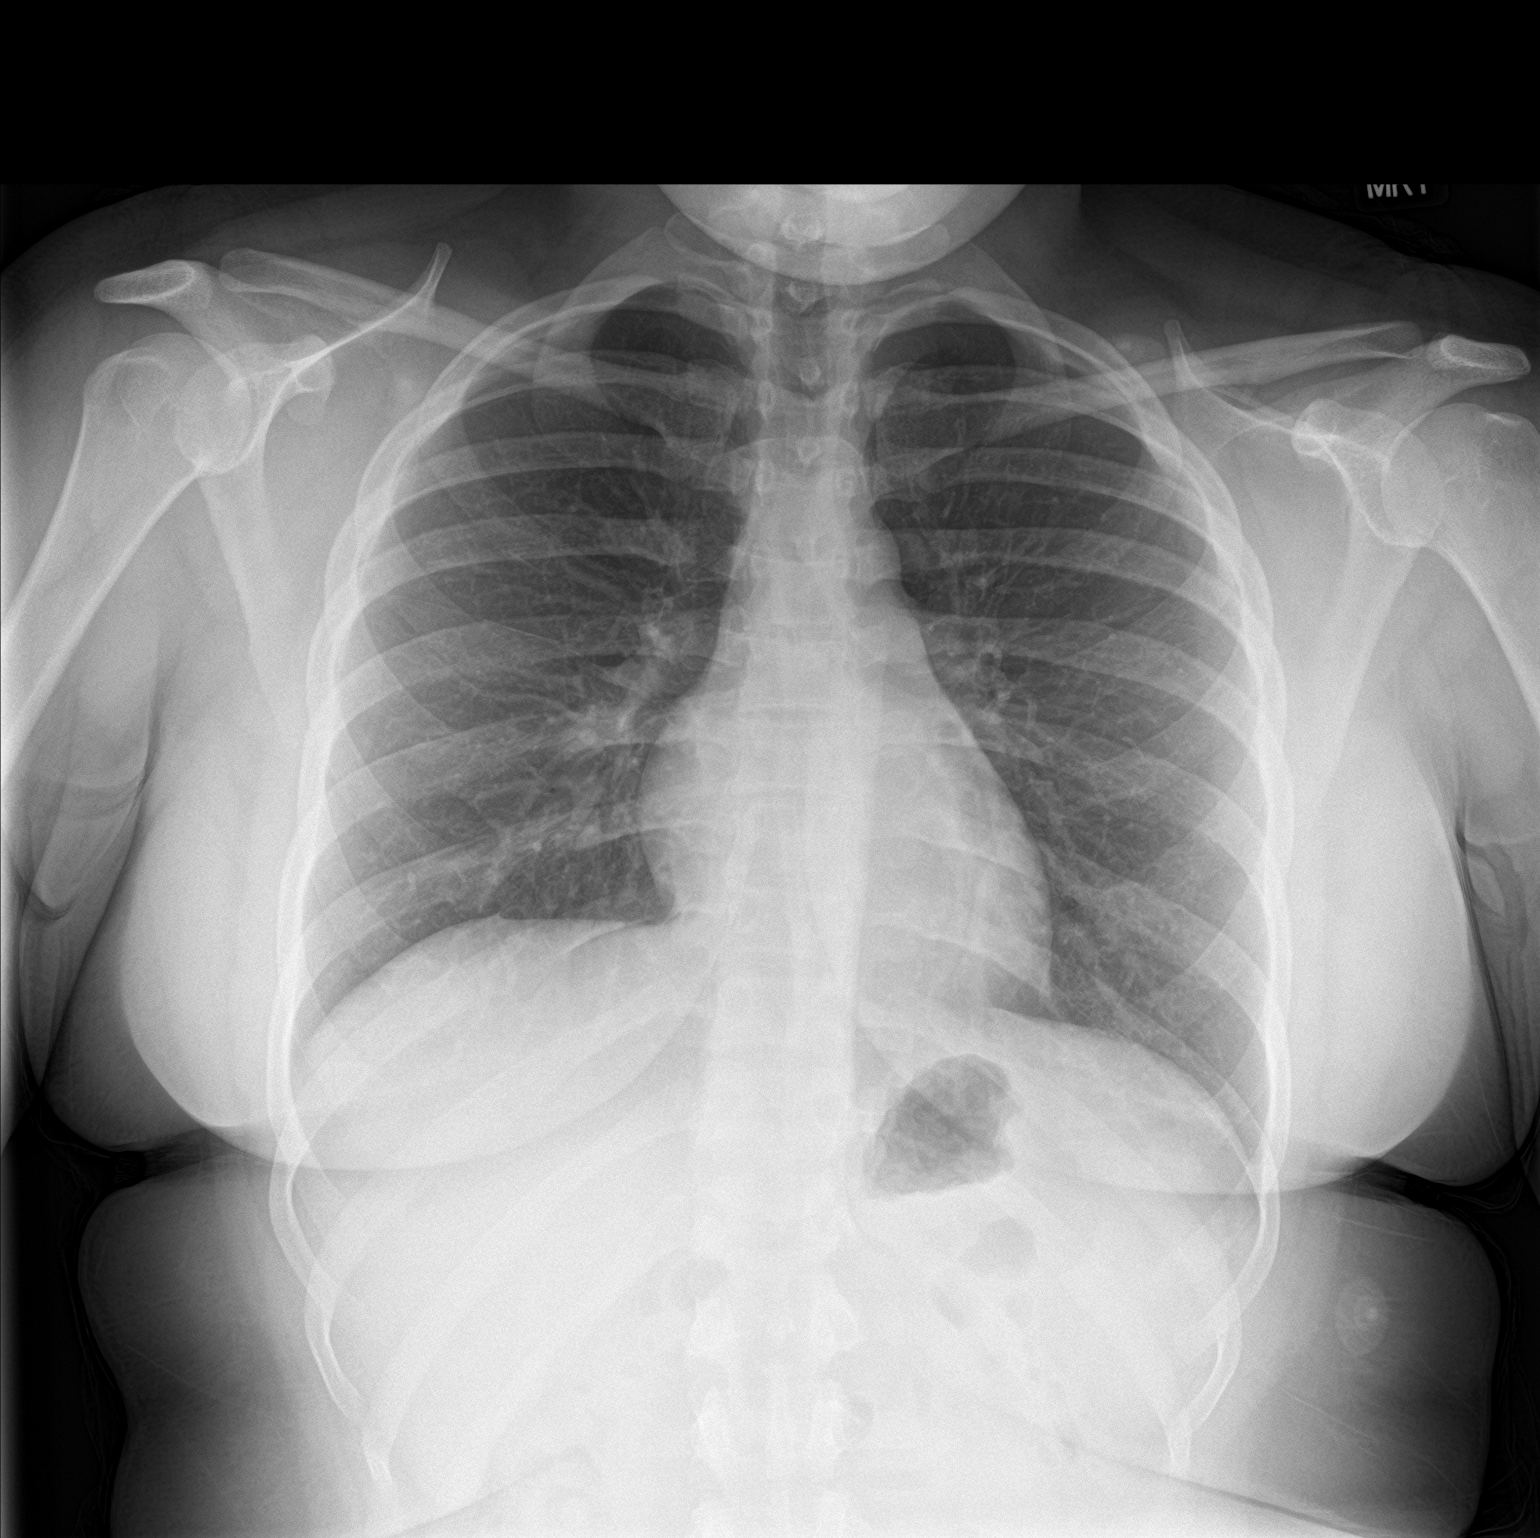

[chest lat]
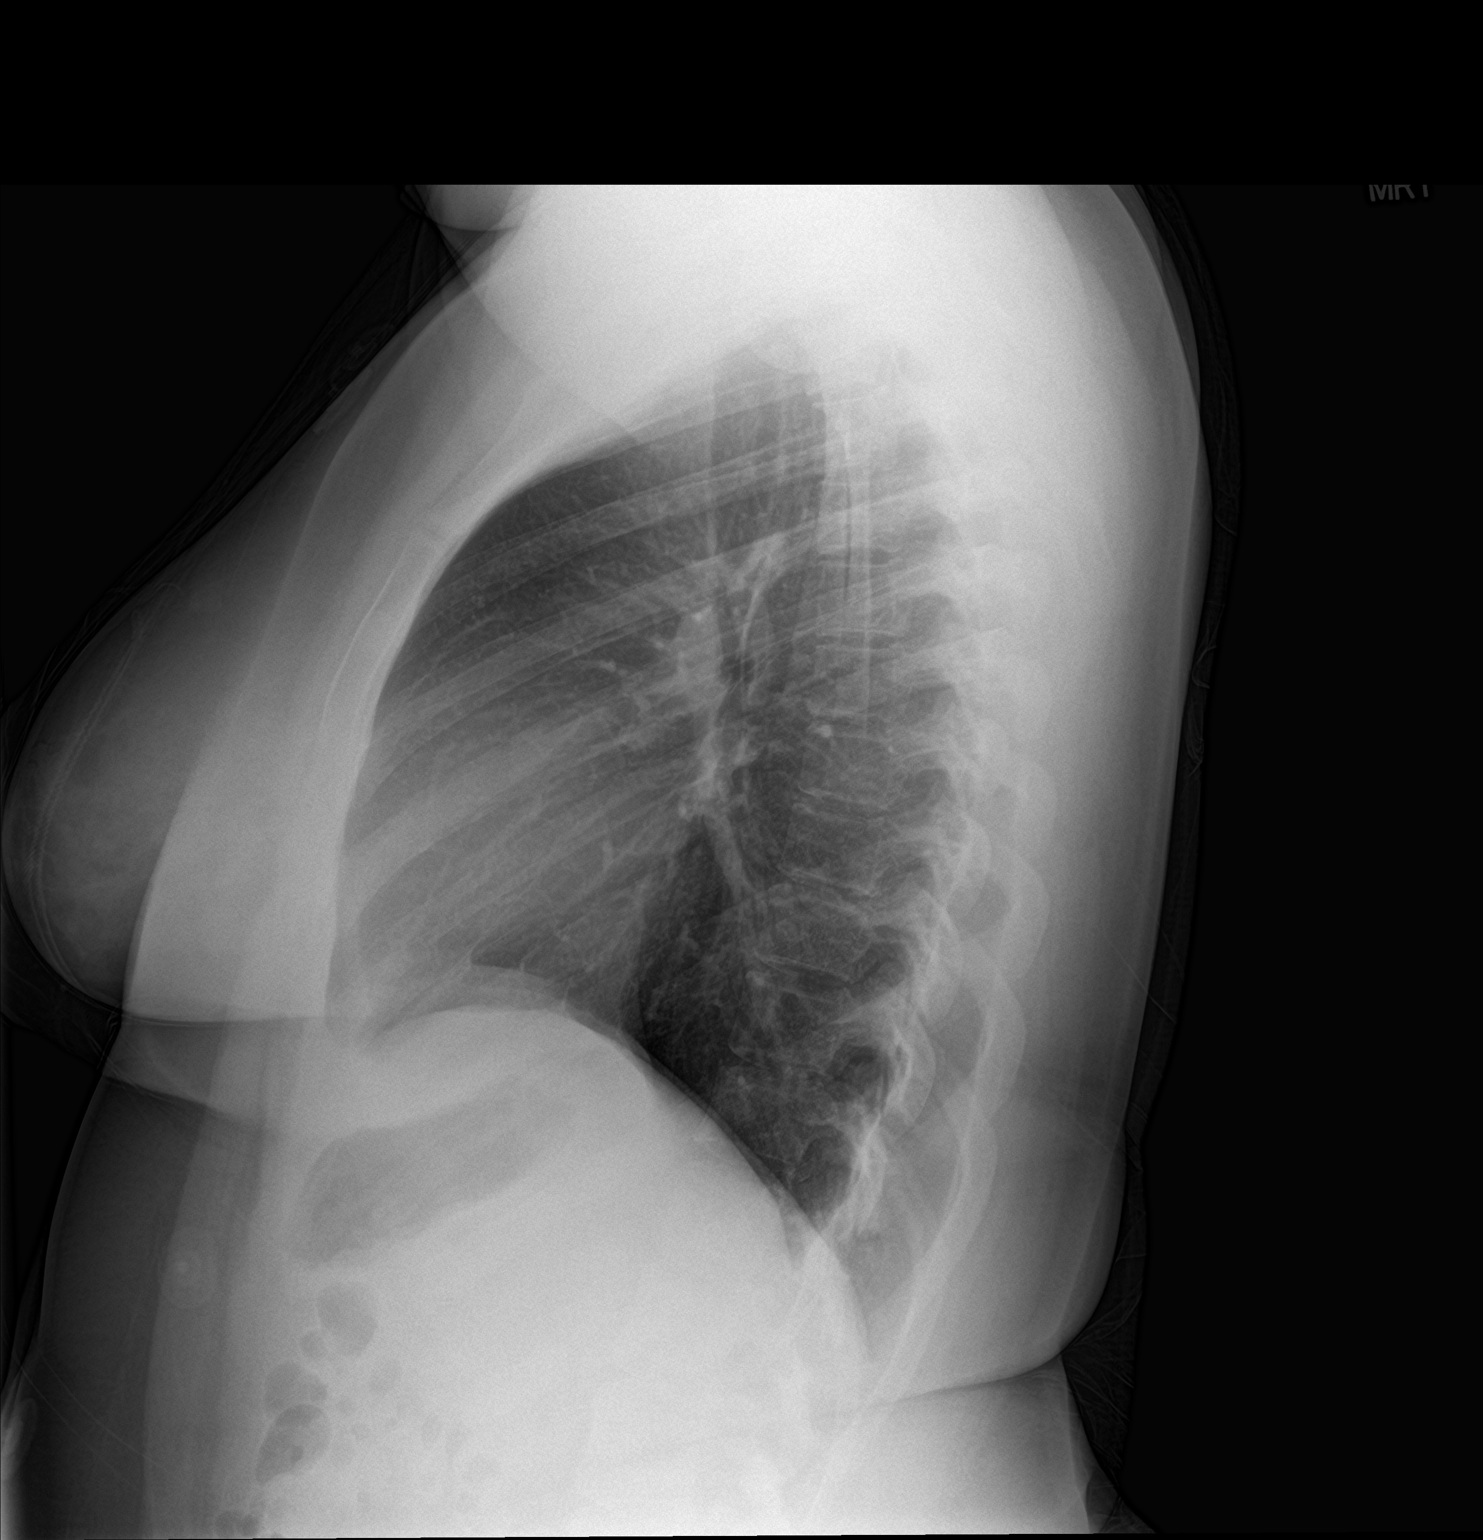

[2 of 2 positions shown; findings below may reference images not displayed]

FINDINGS: Lungs are clear. Cardiothymic silhouette, bones and soft tissues are
within normal.
IMPRESSION: No active cardiopulmonary disease.

## 2020-04-21 ENCOUNTER — Encounter: Payer: Self-pay | Admitting: Family Medicine

## 2020-04-21 ENCOUNTER — Ambulatory Visit (HOSPITAL_COMMUNITY)
Admission: EM | Admit: 2020-04-21 | Discharge: 2020-04-21 | Disposition: A | Discharge status: Home / Self Care | Payer: Medicare Other | Attending: Family Medicine | Admitting: Family Medicine

## 2020-04-21 ENCOUNTER — Other Ambulatory Visit: Payer: Self-pay

## 2020-04-21 ENCOUNTER — Encounter (HOSPITAL_COMMUNITY): Payer: Self-pay | Admitting: Emergency Medicine

## 2020-04-21 DIAGNOSIS — Z87891 Personal history of nicotine dependence: Secondary | ICD-10-CM | POA: Insufficient documentation

## 2020-04-21 DIAGNOSIS — Z3202 Encounter for pregnancy test, result negative: Secondary | ICD-10-CM | POA: Diagnosis not present

## 2020-04-21 DIAGNOSIS — N898 Other specified noninflammatory disorders of vagina: Secondary | ICD-10-CM | POA: Insufficient documentation

## 2020-04-21 DIAGNOSIS — N760 Acute vaginitis: Secondary | ICD-10-CM

## 2020-04-21 DIAGNOSIS — R109 Unspecified abdominal pain: Secondary | ICD-10-CM | POA: Diagnosis not present

## 2020-04-21 LAB — POCT URINALYSIS DIPSTICK, ED / UC
Bilirubin Urine: NEGATIVE
Glucose, UA: NEGATIVE mg/dL
Hgb urine dipstick: NEGATIVE
Nitrite: NEGATIVE
Protein, ur: 30 mg/dL — AB
Specific Gravity, Urine: 1.025 (ref 1.005–1.030)
Urobilinogen, UA: 0.2 mg/dL (ref 0.0–1.0)
pH: 6.5 (ref 5.0–8.0)

## 2020-04-21 LAB — POC URINE PREG, ED: Preg Test, Ur: NEGATIVE

## 2020-04-21 MED ORDER — FLUCONAZOLE 150 MG PO TABS
150.0000 mg | ORAL_TABLET | Freq: Every day | ORAL | 0 refills | Status: DC
Start: 2020-04-21 — End: 2020-06-15

## 2020-04-21 MED ORDER — METRONIDAZOLE 500 MG PO TABS
500.0000 mg | ORAL_TABLET | Freq: Two times a day (BID) | ORAL | 0 refills | Status: DC
Start: 2020-04-21 — End: 2020-06-15

## 2020-04-21 NOTE — ED Triage Notes (Signed)
Pt presents with abdominal pain, vaginal white discharge with odor, and itching, xs 1-2 days.  Denies dysuria, blood in urine, N, V,D, fever,  or back pain. Pt denies taking any OTC medication for symptoms. Pt denies any chance of being pregnant.

## 2020-04-21 NOTE — ED Provider Notes (Signed)
[facility]    CSN: [id number] Arrival date & time: 04/21/20  1003      History   Chief Complaint Chief Complaint  Patient presents with  . Vaginitis    HPI Yolanda Payne is a 24 y.o. female.   Patient is a 24 year old female who presents today with approximate 1 to 2 days of abdominal cramping, white vaginal discharge with odor and itching.  Symptoms have been constant.  History of BV and yeast and feels similar.  Reporting recently used different soap.  Denies dysuria, hematuria, nausea, vomiting or diarrhea.  Has not taken anything for her symptoms.Patient's last menstrual period was 03/18/2020 (approximate).      History reviewed. No pertinent past medical history.  There are no problems to display for this patient.   History reviewed. No pertinent surgical history.  OB History   No obstetric history on file.      Home Medications    Prior to Admission medications   Medication Sig Start Date End Date Taking? Authorizing Provider  fluconazole (DIFLUCAN) 150 MG tablet Take 1 tablet (150 mg total) by mouth daily. 04/21/20   Dahlia Byes A, NP  ibuprofen (ADVIL,MOTRIN) 200 MG tablet Take 200 mg by mouth every 6 (six) hours as needed for headache (pain).    [provider]  metroNIDAZOLE (FLAGYL) 500 MG tablet Take 1 tablet (500 mg total) by mouth 2 (two) times daily. 04/21/20   Dahlia Byes A, NP  Phenazopyridine HCl (AZO-STANDARD PO) Take 1 tablet by mouth once. For urinary tract pain    [provider]    Family History Family History  Problem Relation Age of Onset  . Healthy Mother   . Healthy Father     Social History Social History   Tobacco Use  . Smoking status: Former Games developer  . Smokeless tobacco: Never Used  Substance Use Topics  . Alcohol use: Not Currently  . Drug use: No     Allergies   Patient has no known allergies.   Review of Systems Review of Systems   Physical Exam Triage Vital Signs ED Triage  Vitals  Enc Vitals Group     BP 04/21/20 1054 (!) 106/43     Pulse Rate 04/21/20 1054 (!) 57     Resp 04/21/20 1054 17     Temp 04/21/20 1054 98.6 F (37 C)     Temp Source 04/21/20 1054 Oral     SpO2 04/21/20 1054 100 %     Weight --      Height --      Head Circumference --      Peak Flow --      Pain Score 04/21/20 1052 0     Pain Loc --      Pain Edu? --      Excl. in GC? --    No data found.  Updated Vital Signs BP (!) 106/43 (BP Location: Right Arm)   Pulse (!) 57   Temp 98.6 F (37 C) (Oral)   Resp 17   LMP 03/18/2020 (Approximate)   SpO2 100%   Visual Acuity Right Eye Distance:   Left Eye Distance:   Bilateral Distance:    Right Eye Near:   Left Eye Near:    Bilateral Near:     Physical Exam Vitals and nursing note reviewed.  Constitutional:      General: She is not in acute distress.    Appearance: Normal appearance. She is not ill-appearing, toxic-appearing or diaphoretic.  HENT:     Head: Normocephalic.     Nose: Nose normal.  Eyes:     Conjunctiva/sclera: Conjunctivae normal.  Pulmonary:     Effort: Pulmonary effort is normal.  Musculoskeletal:        General: Normal range of motion.     Cervical back: Normal range of motion.  Skin:    General: Skin is warm and dry.     Findings: No rash.  Neurological:     Mental Status: She is alert.  Psychiatric:        Mood and Affect: Mood normal.      UC Treatments / Results  Labs (all labs ordered are listed, but only abnormal results are displayed) Labs Reviewed  POCT URINALYSIS DIPSTICK, ED / UC - Abnormal; Notable for the following components:      Result Value   Ketones, ur TRACE (*)    Protein, ur 30 (*)    Leukocytes,Ua TRACE (*)    All other components within normal limits  URINE CULTURE  POC URINE PREG, ED  CERVICOVAGINAL ANCILLARY ONLY    EKG   Radiology No results found.  Procedures Procedures (including critical care time)  Medications Ordered in UC Medications - No  data to display  Initial Impression / Assessment and Plan / UC Course  I have reviewed the triage vital signs and the nursing notes.  Pertinent labs & imaging results that were available during my care of the patient were reviewed by me and considered in my medical decision making (see chart for details).     Vaginal discharge Treating for BV and yeast based on history and symptoms.  Sending swab for testing. Urine with trace leuks sending for culture. Final Clinical Impressions(s) / UC Diagnoses   Final diagnoses:  Vaginal discharge     Discharge Instructions     Treating you for bacteria and yeast.  Take medication as prescribed.  Sending a swab for further testing. Your urine did not show any infection or pregnancy.    ED Prescriptions    Medication Sig Dispense Auth. Provider   metroNIDAZOLE (FLAGYL) 500 MG tablet Take 1 tablet (500 mg total) by mouth 2 (two) times daily. 14 tablet Dina Warbington A, NP   fluconazole (DIFLUCAN) 150 MG tablet Take 1 tablet (150 mg total) by mouth daily. 2 tablet Dahlia Byes A, NP     PDMP not reviewed this encounter.   Janace Aris, NP 04/21/20 1906

## 2020-04-21 NOTE — Discharge Instructions (Addendum)
Treating you for bacteria and yeast.  Take medication as prescribed.  Sending a swab for further testing. Your urine did not show any infection or pregnancy.

## 2020-04-22 LAB — CERVICOVAGINAL ANCILLARY ONLY
Bacterial Vaginitis (gardnerella): POSITIVE — AB
Candida Glabrata: NEGATIVE
Candida Vaginitis: NEGATIVE
Chlamydia: NEGATIVE
Comment: NEGATIVE
Comment: NEGATIVE
Comment: NEGATIVE
Comment: NEGATIVE
Comment: NEGATIVE
Comment: NORMAL
Neisseria Gonorrhea: NEGATIVE
Trichomonas: NEGATIVE

## 2020-04-23 ENCOUNTER — Encounter: Payer: Self-pay | Admitting: Family Medicine

## 2020-04-23 LAB — URINE CULTURE

## 2020-06-15 ENCOUNTER — Encounter (HOSPITAL_COMMUNITY): Payer: Self-pay | Admitting: *Deleted

## 2020-06-15 ENCOUNTER — Ambulatory Visit (HOSPITAL_COMMUNITY)
Admission: EM | Admit: 2020-06-15 | Discharge: 2020-06-15 | Disposition: A | Discharge status: Home / Self Care | Payer: Medicare Other | Attending: Family | Admitting: Family

## 2020-06-15 ENCOUNTER — Other Ambulatory Visit: Payer: Self-pay

## 2020-06-15 DIAGNOSIS — Z202 Contact with and (suspected) exposure to infections with a predominantly sexual mode of transmission: Secondary | ICD-10-CM | POA: Diagnosis present

## 2020-06-15 DIAGNOSIS — Z3202 Encounter for pregnancy test, result negative: Secondary | ICD-10-CM | POA: Diagnosis not present

## 2020-06-15 DIAGNOSIS — N898 Other specified noninflammatory disorders of vagina: Secondary | ICD-10-CM

## 2020-06-15 LAB — POCT URINALYSIS DIPSTICK, ED / UC
Bilirubin Urine: NEGATIVE
Glucose, UA: NEGATIVE mg/dL
Hgb urine dipstick: NEGATIVE
Ketones, ur: NEGATIVE mg/dL
Nitrite: NEGATIVE
Protein, ur: NEGATIVE mg/dL
Specific Gravity, Urine: 1.025 (ref 1.005–1.030)
Urobilinogen, UA: 0.2 mg/dL (ref 0.0–1.0)
pH: 7 (ref 5.0–8.0)

## 2020-06-15 LAB — HIV ANTIBODY (ROUTINE TESTING W REFLEX): HIV Screen 4th Generation wRfx: NONREACTIVE

## 2020-06-15 LAB — POC URINE PREG, ED: Preg Test, Ur: NEGATIVE

## 2020-06-15 MED ORDER — METRONIDAZOLE 0.75 % VA GEL: 1.0000 | Freq: Every day | VAGINAL | 2 refills | Status: AC

## 2020-06-15 MED ORDER — FLUCONAZOLE 150 MG PO TABS: 150.0000 mg | ORAL_TABLET | Freq: Once | ORAL | 0 refills | Status: AC

## 2020-06-15 NOTE — ED Triage Notes (Signed)
Patient in with a fishy odor to vaginal area that was noted last night. Patient states that she has a clear white bump to the inside of vagina that she noticed last night. Patient states states that she is also experiencing clear discharge. Patient has complaints of lower abdominal pain but denies any pain currently. Patient states she has a history of BV and is requesting the gel instead of the tablets if she receives treatment today.

## 2020-06-15 NOTE — Discharge Instructions (Signed)
Recommend start Metrogel- insert one applicatorful vaginally at night for 5 nights. Then recommend use for 2 days once a month to help prevent BV recurrence. Continue to clean with warm water and mild soap. Continue to use condoms with each and every future sexual encounter. Follow-up pending lab results.

## 2020-06-15 NOTE — ED Provider Notes (Signed)
[facility]    CSN: [id number] Arrival date & time: 06/15/20  5035      History   Chief Complaint Chief Complaint  Patient presents with   Vaginal Odor    HPI Yolanda Payne is a 24 y.o. female.   24 year old female presents with fishy vaginal odor that started last night. Also noticed more clear vaginal discharge. When checking her discharge last night, she noticed a small bump on the right side of her vaginal opening and concerned over etiology. Is not painful. Also having intermittent lower abdominal pain but denies any fever, nausea, vomiting, low back pain, dysuria, or ulcers. Has history of recurrent BV. Last episode about 6 weeks ago and was treated with Flagyl tablets. Requesting Metrogel if possible. Has been sexually active and uses condoms in the past but not currently sexually active. LMP 05/31/2020 and was normal. Was negative for GC/Chlamydia 6 weeks ago so requests testing just for BV/yeast and HIV today. No other chronic health issues. Takes no daily medication.   The history is provided by the patient.    History reviewed. No pertinent past medical history.  There are no problems to display for this patient.   History reviewed. No pertinent surgical history.  OB History   No obstetric history on file.      Home Medications    Prior to Admission medications   Medication Sig Start Date End Date Taking? Authorizing Provider  fluconazole (DIFLUCAN) 150 MG tablet Take 1 tablet (150 mg total) by mouth once for 1 dose. May repeat 1 tablet by mouth if needed. 06/15/20 06/15/20  Sudie Grumbling, NP  ibuprofen (ADVIL,MOTRIN) 200 MG tablet Take 200 mg by mouth every 6 (six) hours as needed for headache (pain).    [provider]  metroNIDAZOLE (METROGEL VAGINAL) 0.75 % vaginal gel Place 1 Applicatorful vaginally at bedtime for 5 days. 06/15/20 06/20/20  Sudie Grumbling, NP  Phenazopyridine HCl (AZO-STANDARD PO) Take 1 tablet by mouth once. For  urinary tract pain    [provider]    Family History Family History  Problem Relation Age of Onset   Healthy Mother    Healthy Father     Social History Social History   Tobacco Use   Smoking status: Never Smoker   Smokeless tobacco: Never Used  Building services engineer Use: Never used  Substance Use Topics   Alcohol use: Not Currently   Drug use: No     Allergies   Patient has no known allergies.   Review of Systems Review of Systems  Constitutional: Negative for activity change, appetite change, chills, diaphoresis, fatigue and fever.  HENT: Negative for congestion, mouth sores and sore throat.   Gastrointestinal: Positive for abdominal pain (intermittent). Negative for nausea and vomiting.  Genitourinary: Positive for pelvic pain and vaginal discharge. Negative for decreased urine volume, difficulty urinating, dysuria, flank pain, frequency, genital sores (but positive for vaginal "bump"), hematuria, urgency and vaginal bleeding.  Musculoskeletal: Negative for arthralgias, back pain and myalgias.  Skin: Negative for color change and rash.  Allergic/Immunologic: Negative for environmental allergies, food allergies and immunocompromised state.  Neurological: Negative for seizures, syncope, weakness and headaches.  Hematological: Negative for adenopathy. Does not bruise/bleed easily.     Physical Exam Triage Vital Signs ED Triage Vitals  Enc Vitals Group     BP 06/15/20 1101 108/72     Pulse Rate 06/15/20 1101 83     Resp 06/15/20 1101 16  Temp 06/15/20 1101 98 F (36.7 C)     Temp Source 06/15/20 1101 Oral     SpO2 06/15/20 1101 100 %     Weight --      Height --      Head Circumference --      Peak Flow --      Pain Score 06/15/20 1059 0     Pain Loc --      Pain Edu? --      Excl. in GC? --    No data found.  Updated Vital Signs BP 108/72 (BP Location: Right Arm)    Pulse 83    Temp 98 F (36.7 C) (Oral)    Resp 16    LMP  05/31/2020    SpO2 100%   Visual Acuity Right Eye Distance:   Left Eye Distance:   Bilateral Distance:    Right Eye Near:   Left Eye Near:    Bilateral Near:     Physical Exam Vitals and nursing note reviewed. Exam conducted with a chaperone present.  Constitutional:      General: She is awake. She is not in acute distress.    Appearance: She is well-developed and well-groomed. She is not ill-appearing.     Comments: She is sitting comfortably on the exam table in no acute distress.   HENT:     Head: Normocephalic and atraumatic.     Right Ear: External ear normal.     Left Ear: External ear normal.  Eyes:     Extraocular Movements: Extraocular movements intact.     Conjunctiva/sclera: Conjunctivae normal.  Cardiovascular:     Rate and Rhythm: Normal rate and regular rhythm.     Heart sounds: Normal heart sounds. No murmur heard.   Pulmonary:     Effort: Pulmonary effort is normal. No respiratory distress.     Breath sounds: Normal breath sounds and air entry. No decreased air movement. No decreased breath sounds, wheezing or rhonchi.  Abdominal:     General: Abdomen is flat. Bowel sounds are normal.     Palpations: Abdomen is soft.     Tenderness: There is no abdominal tenderness. There is no right CVA tenderness, left CVA tenderness, guarding or rebound.     Hernia: There is no hernia in the left inguinal area or right inguinal area.  Genitourinary:    General: Normal vulva.     Exam position: Knee-chest position.     Pubic Area: No rash.      Labia:        Right: No rash, tenderness or lesion.        Left: No rash, tenderness or lesion.      Vagina: No foreign body. Vaginal discharge (thin white with slight odor) present. No erythema, tenderness, bleeding or lesions.     Cervix: Normal.     Uterus: Normal.      Adnexa: Right adnexa normal and left adnexa normal.     Comments: Unable to visualize or feel any vaginal or labial bump or cyst. Non-tender area.    Musculoskeletal:        General: Normal range of motion.     Cervical back: Normal range of motion.  Lymphadenopathy:     Lower Body: No right inguinal adenopathy. No left inguinal adenopathy.  Skin:    General: Skin is warm and dry.     Findings: No rash.  Neurological:     General: No focal deficit present.  Mental Status: She is alert and oriented to person, place, and time.  Psychiatric:        Mood and Affect: Mood normal.        Behavior: Behavior normal. Behavior is cooperative.        Thought Content: Thought content normal.        Judgment: Judgment normal.      UC Treatments / Results  Labs (all labs ordered are listed, but only abnormal results are displayed) Labs Reviewed  POCT URINALYSIS DIPSTICK, ED / UC - Abnormal; Notable for the following components:      Result Value   Leukocytes,Ua TRACE (*)    All other components within normal limits  HIV ANTIBODY (ROUTINE TESTING W REFLEX)  POC URINE PREG, ED  CERVICOVAGINAL ANCILLARY ONLY    EKG   Radiology No results found.  Procedures Procedures (including critical care time)  Medications Ordered in UC Medications - No data to display  Initial Impression / Assessment and Plan / UC Course  I have reviewed the triage vital signs and the nursing notes.  Pertinent labs & imaging results that were available during my care of the patient were reviewed by me and considered in my medical decision making (see chart for details).    Reviewed negative urine pregnancy test with patient. Also reviewed essentially negative urinalysis. Trace WBC's present which may be due to vaginitis. Doubt UTI. Did not send urine for culture. Discussed that no bump or cyst present in labia or vaginal area. No signs of HSV. If cyst was present, may have resolved on own. Discussed that history and clinical findings suggestive of BV. Will start Metrogel- one applicatorful - insert vaginally nightly for 5 days. To help prevent  recurrence, may use Metrogel 1 to 2 nights for 1 week, once a month before her period (since BV symptoms tend to occur 1 to 2 weeks before menses). May also take Diflucan 150mg  once and repeat 1 tablet in 3 days if needed. Continue to clean with warm water and mild soap- continue to avoid douching or fragrant body washes. Encouraged to continue condoms use with each and every future sexual encounter. Follow-up pending lab results.   Final Clinical Impressions(s) / UC Diagnoses   Final diagnoses:  Vaginal odor  Vaginal discharge  Potential exposure to STD     Discharge Instructions     Recommend start Metrogel- insert one applicatorful vaginally at night for 5 nights. Then recommend use for 2 days once a month to help prevent BV recurrence. Continue to clean with warm water and mild soap. Continue to use condoms with each and every future sexual encounter. Follow-up pending lab results.     ED Prescriptions    Medication Sig Dispense Auth. Provider   metroNIDAZOLE (METROGEL VAGINAL) 0.75 % vaginal gel Place 1 Applicatorful vaginally at bedtime for 5 days. 70 g , NP   fluconazole (DIFLUCAN) 150 MG tablet Take 1 tablet (150 mg total) by mouth once for 1 dose. May repeat 1 tablet by mouth if needed. 2 tablet Luke Rigsbee, Sudie Grumbling, NP     PDMP not reviewed this encounter.   Ali Lowe, NP 06/15/20 1723

## 2020-06-16 LAB — CERVICOVAGINAL ANCILLARY ONLY
Bacterial Vaginitis (gardnerella): NEGATIVE
Candida Glabrata: NEGATIVE
Candida Vaginitis: NEGATIVE
Comment: NEGATIVE
Comment: NEGATIVE
Comment: NEGATIVE
Comment: NEGATIVE
Trichomonas: NEGATIVE

## 2020-07-05 ENCOUNTER — Ambulatory Visit (HOSPITAL_COMMUNITY)
Admission: EM | Admit: 2020-07-05 | Discharge: 2020-07-05 | Disposition: A | Discharge status: Home / Self Care | Payer: Medicare Other

## 2020-07-05 NOTE — ED Notes (Signed)
No answer in lobby or  when called on the phone per Reita May, cma

## 2020-07-05 NOTE — ED Notes (Signed)
Staff called patient 3x, no answer, pt was not in the lobby. Staff called patient by phone and she did not pick up. Pt had already left before she could be seen.

## 2020-08-20 ENCOUNTER — Telehealth: Payer: Medicare Other | Admitting: Physician Assistant

## 2020-08-20 DIAGNOSIS — N760 Acute vaginitis: Secondary | ICD-10-CM | POA: Diagnosis not present

## 2020-08-20 MED ORDER — METRONIDAZOLE 500 MG PO TABS
500.0000 mg | ORAL_TABLET | Freq: Two times a day (BID) | ORAL | 0 refills | Status: DC
Start: 2020-08-20 — End: 2020-11-21

## 2020-08-20 MED ORDER — FLUCONAZOLE 150 MG PO TABS
150.0000 mg | ORAL_TABLET | Freq: Once | ORAL | 0 refills | Status: AC
Start: 2020-08-20 — End: 2020-08-20

## 2020-08-20 NOTE — Progress Notes (Signed)
We are sorry that you are not feeling well. Here is how we plan to help! Based on what you shared with me it looks like you: May have a vaginosis due to bacteria and also due to yeast  Vaginosis is an inflammation of the vagina that can result in discharge, itching and pain. The cause is usually a change in the normal balance of vaginal bacteria or an infection. Vaginosis can also result from reduced estrogen levels after menopause.  The most common causes of vaginosis are:   Bacterial vaginosis which results from an overgrowth of one on several organisms that are normally present in your vagina.   Yeast infections which are caused by a naturally occurring fungus called candida.   Vaginal atrophy (atrophic vaginosis) which results from the thinning of the vagina from reduced estrogen levels after menopause.   Trichomoniasis which is caused by a parasite and is commonly transmitted by sexual intercourse.  Factors that increase your risk of developing vaginosis include: Marland Kitchen Medications, such as antibiotics and steroids . Uncontrolled diabetes . Use of hygiene products such as bubble bath, vaginal spray or vaginal deodorant . Douching . Wearing damp or tight-fitting clothing . Using an intrauterine device (IUD) for birth control . Hormonal changes, such as those associated with pregnancy, birth control pills or menopause . Sexual activity . Having a sexually transmitted infection  Your treatment plan is Metronidazole or Flagyl 500mg  twice a day for 7 days.  I have electronically sent this prescription into the pharmacy that you have chosen. I have also sent in a one time 150 mg oral diflucan prescription.  Be sure to take all of the medication as directed. Stop taking any medication if you develop a rash, tongue swelling or shortness of breath. Mothers who are breast feeding should consider pumping and discarding their breast milk while on these antibiotics. However, there is no consensus that  infant exposure at these doses would be harmful.  Remember that medication creams can weaken latex condoms.   HOME CARE:  Good hygiene may prevent some types of vaginosis from recurring and may relieve some symptoms:  . Avoid baths, hot tubs and whirlpool spas. Rinse soap from your outer genital area after a shower, and dry the area well to prevent irritation. Don't use scented or harsh soaps, such as those with deodorant or antibacterial action. Marland Kitchen Avoid irritants. These include scented tampons and pads. . Wipe from front to back after using the toilet. Doing so avoids spreading fecal bacteria to your vagina.  Other things that may help prevent vaginosis include:  Marland Kitchen Don't douche. Your vagina doesn't require cleansing other than normal bathing. Repetitive douching disrupts the normal organisms that reside in the vagina and can actually increase your risk of vaginal infection. Douching won't clear up a vaginal infection. . Use a latex condom. Both female and female latex condoms may help you avoid infections spread by sexual contact. . Wear cotton underwear. Also wear pantyhose with a cotton crotch. If you feel comfortable without it, skip wearing underwear to bed. Yeast thrives in Marland Kitchen Your symptoms should improve in the next day or two.  GET HELP RIGHT AWAY IF:  . You have pain in your lower abdomen ( pelvic area or over your ovaries) . You develop nausea or vomiting . You develop a fever . Your discharge changes or worsens . You have persistent pain with intercourse . You develop shortness of breath, a rapid pulse, or you faint.  These symptoms  could be signs of problems or infections that need to be evaluated by a medical provider now.  MAKE SURE YOU    Understand these instructions.  Will watch your condition.  Will get help right away if you are not doing well or get worse.  Your e-visit answers were reviewed by a board certified advanced clinical  practitioner to complete your personal care plan. Depending upon the condition, your plan could have included both over the counter or prescription medications. Please review your pharmacy choice to make sure that you have choses a pharmacy that is open for you to pick up any needed prescription, Your safety is important to [place]. If you have drug allergies check your prescription carefully.   You can use MyChart to ask questions about today's visit, request a non-urgent call back, or ask for a work or school excuse for 24 hours related to this e-Visit. If it has been greater than 24 hours you will need to follow up with your provider, or enter a new e-Visit to address those concerns. You will get a MyChart message within the next two days asking about your experience. I hope that your e-visit has been valuable and will speed your recovery.  Greater than 5 minutes, yet less than 10 minutes of time have been spent researching, coordinating, and implementing care for this patient today.  Jarold Motto PA-C

## 2020-08-28 ENCOUNTER — Telehealth (HOSPITAL_COMMUNITY): Payer: Self-pay

## 2020-08-28 NOTE — Telephone Encounter (Signed)
Returning phone call to pt.Pt was told to contact e-visit program, as she have some questions about the prescriptions and some symptoms.

## 2020-09-21 ENCOUNTER — Other Ambulatory Visit: Payer: Self-pay | Admitting: Physician Assistant

## 2020-09-21 ENCOUNTER — Telehealth: Payer: Medicare Other | Admitting: Nurse Practitioner

## 2020-09-21 DIAGNOSIS — N760 Acute vaginitis: Secondary | ICD-10-CM

## 2020-09-21 NOTE — Progress Notes (Signed)
Based on what you shared with me it looks like you have recurrent bacterial vaginosis.,that should be evaluated in a face to face office visit. According to your records thsi has been an issue several time sin the last several months. You will need a face to face visit for proper treatment and to determine why these keep occurring.  Many offices offer virtual options to be seen via video if you are not comfortable going in person to a medical facility at this time.  If you do not have a PCP, [facility] offers a free physician referral service available at [phone]. Our trained staff has the experience, knowledge and resources to put you in touch with a physician who is right for you.   You also have the option of a video visit through [email or web address]  If you are having a true medical emergency please call 911.  NOTE: If you entered your credit card information for this eVisit, you will not be charged. You may see a "hold" on your card for the $35 but that hold will drop off and you will not have a charge processed.  Your e-visit answers were reviewed by a board certified advanced clinical practitioner to complete your personal care plan.  Thank you for using e-Visits.  5-10 minutes spent reviewing and documenting in chart.

## 2020-10-18 ENCOUNTER — Encounter: Payer: Self-pay | Admitting: Family Medicine

## 2020-10-18 ENCOUNTER — Telehealth: Payer: Medicare Other | Admitting: Family Medicine

## 2020-10-18 DIAGNOSIS — N898 Other specified noninflammatory disorders of vagina: Secondary | ICD-10-CM | POA: Insufficient documentation

## 2020-10-18 NOTE — Patient Instructions (Signed)
I appreciate the opportunity to provide you with care for your health and wellness.  Follow up: w PCP for vaginal swab testing   You are encouraged to get a swab test to make sure BV is what needs to be treated as you continue to have on going symptoms.  Please continue to practice social distancing to keep you, your family, and our community safe.  If you must go out, please wear a mask and practice good handwashing.   Vaginitis  Vaginitis is irritation and swelling of the vagina. Treatment will depend on the cause. What are the causes? It can be caused by:  Bacteria.  Yeast.  A parasite.  A virus.  Low hormone levels.  Bubble baths, scented tampons, and feminine sprays. Other things can change the balance of the yeast and bacteria that live in the vagina. These include:  Antibiotic medicines.  Not being clean enough.  Some birth control methods.  Sex.  Infection.  Diabetes.  A weakened body defense system (immune system). What increases the risk?  Smoking or being around someone who smokes.  Using washes (douches), scented tampons, or scented pads.  Wearing tight pants or thong underwear.  Using birth control pills or an IUD.  Having sex without a condom or having a lot of partners.  Having an STI.  Using a certain product to kill sperm (nonoxynol-9).  Eating foods that are high in sugar.  Having diabetes.  Having low levels of a female hormone.  Having a weakened body defense system.  Being pregnant or breastfeeding. What are the signs or symptoms?  Fluid coming from the vagina that is not normal.  A bad smell.  Itching, pain, or swelling.  Pain with sex.  Pain or burning when you pee (urinate). Sometimes there are no symptoms. How is this treated? Treatment may include:  Antibiotic creams or pills.  Antifungal medicines.  Medicines to ease symptoms if you have a virus. Your sex partner should also be treated.  Estrogen  medicines.  Avoiding scented soaps, sprays, or douches.  Stopping use of products that caused irritation and then using a cream to treat symptoms. Follow these instructions at home: Lifestyle  Keep the area around your vagina clean and dry. ? Avoid using soap. ? Rinse the area with water.  Until your doctor says it is okay: ? Do not use washes for the vagina. ? Do not use tampons. ? Do not have sex.  Wipe from front to back after going to the bathroom.  When your doctor says it is okay, practice safe sex and use condoms. General instructions  Take over-the-counter and prescription medicines only as told by your doctor.  If you were prescribed an antibiotic medicine, take or use it as told by your doctor. Do not stop taking or using it even if you start to feel better.  Keep all follow-up visits. How is this prevented?  Do not use things that can irritate the vagina, such as fabric softeners. Avoid these products if they are scented: ? Sprays. ? Detergents. ? Tampons. ? Products for cleaning the vagina. ? Soaps or bubble baths.  Let air reach your vagina. To do this: ? Wear cotton underwear. ? Do not wear:  Underwear while you sleep.  Tight pants.  Thong underwear.  Underwear or nylons without a cotton panel. ? Take off any wet clothing, such as bathing suits, as soon as you can. ? Practice safe sex and use condoms. Contact a doctor if:  You have pain in your belly or in the area between your hips.  You have a fever or chills.  Your symptoms last for more than 2-3 days. Get help right away if:  You have a fever and your symptoms get worse all of a sudden. Summary  Vaginitis is irritation and swelling of the vagina.  Treatment will depend on the cause of the condition.  Do not use washes or tampons or have sex until your doctor says it is okay. This information is not intended to replace advice given to you by your health care provider. Make sure you  discuss any questions you have with your health care provider. Document Revised: 02/25/2020 Document Reviewed: 02/25/2020 Elsevier Patient Education  2021 [employer].

## 2020-10-18 NOTE — Progress Notes (Signed)
Yolanda Payne, jumper are scheduled for a virtual visit with your provider today.    Just as we do with appointments in the office, we must obtain your consent to participate.  Your consent will be active for this visit and any virtual visit you may have with one of our providers in the next 365 days.    If you have a MyChart account, I can also send a copy of this consent to you electronically.  All virtual visits are billed to your insurance company just like a traditional visit in the office.  As this is a virtual visit, video technology does not allow for your provider to perform a traditional examination.  This may limit your provider's ability to fully assess your condition.  If your provider identifies any concerns that need to be evaluated in person or the need to arrange testing such as labs, EKG, etc, we will make arrangements to do so.    Although advances in technology are sophisticated, we cannot ensure that it will always work on either your end or our end.  If the connection with a video visit is poor, we may have to switch to a telephone visit.  With either a video or telephone visit, we are not always able to ensure that we have a secure connection.   I need to obtain your verbal consent now.   Are you willing to proceed with your visit today?   Yolanda Payne has provided verbal consent on 10/18/2020 for a virtual visit (video or telephone).   [name], NP 10/18/2020  10:44 AM   Date:  10/18/2020   ID:  Yolanda Payne, DOB [birth year], MRN [id number]  Patient Location: Home Provider Location: Home Office   Participants: Patient and Provider for Visit and Wrap up  Method of visit: Video  Location of Patient: Home Location of Provider: Home Office Consent was obtain for visit over the video. Services rendered by provider: Visit was performed via video  A video enabled telemedicine application was used and I verified that I am speaking with the correct person using two  identifiers.  PCP:  Cain Saupe, MD (Inactive)   Chief Complaint:  Vaginal itching and discharge   History of Present Illness:    Yolanda Payne is a 25 y.o. female with history as stated below. Presents video telehealth for an acute care visit for on going/recurrent vaginal itching and discharge. Was seen back in Dec for this. Given treatment on 08/20/21 for vaginitis. She followed up about a month later and was advised to see PCP and start Boric acid suppositories. She did not follow up on either of these. Today she reports itching, discharge- with odor, some discomfort while urinating.  She reports not being pregnant and not having intercourse in the last month.  Past Medical, Surgical, Social History, Allergies, and Medications have been Reviewed.  No past medical history on file.  No outpatient medications have been marked as taking for the 10/18/20 encounter (Appointment) with [name], NP.     Allergies:   Patient has no known allergies.   ROS See HPI for history of present illness.  Physical Exam Constitutional:      Appearance: Normal appearance.  HENT:     Head: Normocephalic and atraumatic.     Right Ear: External ear normal.     Left Ear: External ear normal.     Nose: Nose normal.  Eyes:     Extraocular Movements: Extraocular movements intact.  Conjunctiva/sclera: Conjunctivae normal.     Pupils: Pupils are equal, round, and reactive to light.  Musculoskeletal:        General: Normal range of motion.     Cervical back: Normal range of motion.  Neurological:     Mental Status: She is alert and oriented to person, place, and time.  Psychiatric:        Mood and Affect: Mood normal.        Behavior: Behavior normal.        Thought Content: Thought content normal.        Judgment: Judgment normal.               1. Itching in the vaginal area Given the duration of her symptoms- it would be best to have her seen face to face with a provider in  office so a sample can be collected and proper treatment provided. She is provided info about preventive measures and encouraged to try the boric acid once treatment is complete if it is BV related.  -She reports understanding and will call her PCP office  Time:   Today, I have spent 10 minutes with the patient with telehealth technology discussing the above problems, reviewing the chart, previous notes, medications and orders.    Tests Ordered: No orders of the defined types were placed in this encounter.   Medication Changes: No orders of the defined types were placed in this encounter.    Disposition:  Follow up w PCP for testing  Signed, [name], NP  10/18/2020 10:44 AM

## 2020-11-15 ENCOUNTER — Other Ambulatory Visit (HOSPITAL_COMMUNITY)
Admission: RE | Admit: 2020-11-15 | Discharge: 2020-11-15 | Disposition: A | Discharge status: Home / Self Care | Payer: Medicare Other | Source: Ambulatory Visit | Attending: Obstetrics and Gynecology | Admitting: Obstetrics and Gynecology

## 2020-11-15 ENCOUNTER — Other Ambulatory Visit: Payer: Self-pay

## 2020-11-15 ENCOUNTER — Encounter: Payer: Self-pay | Admitting: Obstetrics and Gynecology

## 2020-11-15 ENCOUNTER — Ambulatory Visit (INDEPENDENT_AMBULATORY_CARE_PROVIDER_SITE_OTHER): Payer: Medicare Other | Admitting: Obstetrics and Gynecology

## 2020-11-15 VITALS — BP 128/84 | HR 59 | Ht 63.0 in | Wt 215.0 lb

## 2020-11-15 DIAGNOSIS — Z01419 Encounter for gynecological examination (general) (routine) without abnormal findings: Secondary | ICD-10-CM | POA: Diagnosis present

## 2020-11-15 NOTE — Progress Notes (Signed)
Subjective:     Yolanda Payne is a 25 y.o. female P0 with LMP 11/10/20 and BMI 38 who is here for a comprehensive physical exam. The patient reports vaginal irriation. She is sexually active using condoms for contraception. Patient denies any pelvic pain or abnormal discharge. She reports a history of recurrent BV and yeast infection. She reports a clitoral rash that is painful and has been present for the past 3 days.  No past medical history on file. No past surgical history on file. Family History  Problem Relation Age of Onset  . Healthy Mother   . Healthy Father     Social History   Socioeconomic History  . Marital status: Single    Spouse name: Not on file  . Number of children: Not on file  . Years of education: Not on file  . Highest education level: Not on file  Occupational History  . Not on file  Tobacco Use  . Smoking status: Never Smoker  . Smokeless tobacco: Never Used  Vaping Use  . Vaping Use: Never used  Substance and Sexual Activity  . Alcohol use: Not Currently  . Drug use: No  . Sexual activity: Not Currently    Birth control/protection: None  Other Topics Concern  . Not on file  Social History Narrative  . Not on file   Social Determinants of Health   Financial Resource Strain: Not on file  Food Insecurity: Not on file  Transportation Needs: Not on file  Physical Activity: Not on file  Stress: Not on file  Social Connections: Not on file  Intimate Partner Violence: Not on file   Health Maintenance  Topic Date Due  . Hepatitis C Screening  Never done  . COVID-19 Vaccine (1) Never done  . HPV VACCINES (1 - 2-dose series) Never done  . TETANUS/TDAP  Never done  . PAP-Cervical Cytology Screening  Never done  . PAP SMEAR-Modifier  Never done  . INFLUENZA VACCINE  Never done  . HIV Screening  Completed       Review of Systems Pertinent items noted in HPI and remainder of comprehensive ROS otherwise negative.   Objective:  Blood  pressure 128/84, pulse (!) 59, height 5\' 3"  (1.6 m), weight 215 lb (97.5 kg), last menstrual period 11/10/2020.     GENERAL: Well-developed, well-nourished female in no acute distress.  HEENT: Normocephalic, atraumatic. Sclerae anicteric.  NECK: Supple. Normal thyroid.  LUNGS: Clear to auscultation bilaterally.  HEART: Regular rate and rhythm. BREASTS: Symmetric in size. No palpable masses or lymphadenopathy, skin changes, or nipple drainage. ABDOMEN: Soft, nontender, nondistended. No organomegaly. PELVIC: Normal external female genitalia with 2 mm lesion on clitoris concerning for herpes. Vagina is pink and rugated.  Normal discharge. Normal appearing cervix. Uterus is normal in size.  No adnexal mass or tenderness. EXTREMITIES: No cyanosis, clubbing, or edema, 2+ distal pulses.    Assessment:    Healthy female exam.      Plan:    Pap smear collected STI testing collected HSV culture ordered Patient will be contacted with abnormal results Perineal care reviewed See After Visit Summary for Counseling Recommendations

## 2020-11-15 NOTE — Progress Notes (Signed)
Pt is having vaginal irritation, possibly from pad use,  and clitoral problem.  Pt thinks she may have swollen lymph nodes in pelvic area.  Pt states she has hx of frequent bv and yeast.

## 2020-11-16 LAB — HERPES SIMPLEX VIRUS CULTURE

## 2020-11-16 LAB — CYTOLOGY - PAP: Diagnosis: NEGATIVE

## 2020-11-16 LAB — CERVICOVAGINAL ANCILLARY ONLY
Bacterial Vaginitis (gardnerella): POSITIVE — AB
Candida Glabrata: NEGATIVE
Candida Vaginitis: NEGATIVE
Chlamydia: NEGATIVE
Comment: NEGATIVE
Comment: NEGATIVE
Comment: NEGATIVE
Comment: NEGATIVE
Comment: NEGATIVE
Comment: NORMAL
Neisseria Gonorrhea: NEGATIVE
Trichomonas: NEGATIVE

## 2020-11-16 LAB — RPR: RPR Ser Ql: NONREACTIVE

## 2020-11-16 LAB — HEPATITIS B SURFACE ANTIGEN: Hepatitis B Surface Ag: NEGATIVE

## 2020-11-16 LAB — HIV ANTIBODY (ROUTINE TESTING W REFLEX): HIV Screen 4th Generation wRfx: NONREACTIVE

## 2020-11-16 LAB — HEPATITIS C ANTIBODY: Hep C Virus Ab: <0.1 s/co ratio (ref 0.0–0.9)

## 2020-11-17 MED ORDER — METRONIDAZOLE 500 MG PO TABS: 500.0000 mg | ORAL_TABLET | Freq: Two times a day (BID) | ORAL | 0 refills | Status: DC

## 2020-11-17 NOTE — Addendum Note (Signed)
Addended by: Catalina Antigua on: 11/17/2020 09:11 AM   Modules accepted: Orders

## 2020-11-21 ENCOUNTER — Other Ambulatory Visit: Payer: Self-pay

## 2020-11-21 ENCOUNTER — Encounter (HOSPITAL_COMMUNITY): Payer: Self-pay

## 2020-11-21 ENCOUNTER — Ambulatory Visit (HOSPITAL_COMMUNITY)
Admission: RE | Admit: 2020-11-21 | Discharge: 2020-11-21 | Disposition: A | Discharge status: Home / Self Care | Payer: Medicare Other | Source: Ambulatory Visit | Attending: Family Medicine | Admitting: Family Medicine

## 2020-11-21 VITALS — BP 115/77 | HR 71 | Temp 98.8°F | Resp 18

## 2020-11-21 DIAGNOSIS — Z113 Encounter for screening for infections with a predominantly sexual mode of transmission: Secondary | ICD-10-CM | POA: Diagnosis not present

## 2020-11-21 DIAGNOSIS — Z20822 Contact with and (suspected) exposure to covid-19: Secondary | ICD-10-CM | POA: Insufficient documentation

## 2020-11-21 DIAGNOSIS — N898 Other specified noninflammatory disorders of vagina: Secondary | ICD-10-CM | POA: Diagnosis present

## 2020-11-21 LAB — SARS CORONAVIRUS 2 (TAT 6-24 HRS): SARS Coronavirus 2: NEGATIVE

## 2020-11-21 MED ORDER — LIDOCAINE-EPINEPHRINE 1 %-1:100000 IJ SOLN
INTRAMUSCULAR | Status: AC
  Filled 2020-11-21: qty 1

## 2020-11-21 NOTE — ED Provider Notes (Signed)
  [facility]   [id number] 11/21/20 Arrival Time: 1034  ASSESSMENT & PLAN:  1. Vaginal lesion      Discharge Instructions     We have sent a swab that looks for herpes. We will notify you of any positive results once they are received. If required, we will prescribe any medications you might need.  Please refrain from all sexual activity for at least the next seven days.     Without s/s of PID.  Labs Reviewed  SARS CORONAVIRUS 2 (TAT 6-24 HRS)  HSV CULTURE AND TYPING  CERVICOVAGINAL ANCILLARY ONLY   COVID testing sent upon request.  Will notify of any positive results. Instructed to refrain from sexual activity for at least seven days.  Reviewed expectations re: course of current medical issues. Questions answered. Outlined signs and symptoms indicating need for more acute intervention. Patient verbalized understanding. After Visit Summary given.   SUBJECTIVE:  Yolanda Payne is a 25 y.o. female who reports a small lesion on vagina; reports this was swabbed for HSV a few d ago but swab was expired so test was cancelled. Requests swab. Not painful. Present for sev days. No h/o similar. No injury to area.  Patient's last menstrual period was 11/10/2020.   OBJECTIVE:  Vitals:   11/21/20 1100  BP: 115/77  Pulse: 71  Resp: 18  Temp: 98.8 F (37.1 C)  SpO2: 100%     General appearance: alert, cooperative, appears stated age and no distress Lungs: unlabored respirations; speaks full sentences without difficulty GU: approx 2-3 mm round shallow lesion at superior vagina; non-tender; no bleeding or drainage; no areas of fluctuance Skin: warm and dry Psychological: alert and cooperative; normal mood and affect.   Labs Reviewed  SARS CORONAVIRUS 2 (TAT 6-24 HRS)  HSV CULTURE AND TYPING  CERVICOVAGINAL ANCILLARY ONLY    No Known Allergies  History reviewed. No pertinent past medical history. Family History  Problem Relation Age of Onset  .  Healthy Mother   . Healthy Father   . Hypertension Father    Social History   Socioeconomic History  . Marital status: Single    Spouse name: Not on file  . Number of children: Not on file  . Years of education: Not on file  . Highest education level: Not on file  Occupational History  . Not on file  Tobacco Use  . Smoking status: Never Smoker  . Smokeless tobacco: Never Used  Vaping Use  . Vaping Use: Never used  Substance and Sexual Activity  . Alcohol use: Not Currently  . Drug use: No  . Sexual activity: Yes    Birth control/protection: None  Other Topics Concern  . Not on file  Social History Narrative  . Not on file   Social Determinants of Health   Financial Resource Strain: Not on file  Food Insecurity: Not on file  Transportation Needs: Not on file  Physical Activity: Not on file  Stress: Not on file  Social Connections: Not on file  Intimate Partner Violence: Not on file          Mardella Layman, MD 11/21/20 1344

## 2020-11-21 NOTE — ED Triage Notes (Signed)
Pt in requesting HSV swab.  States she was seen at her PCP but they swabbed her with expired supplies  Also requesting covid test, denies any sxs

## 2020-11-21 NOTE — Discharge Instructions (Addendum)
We have sent a swab that looks for herpes. We will notify you of any positive results once they are received. If required, we will prescribe any medications you might need.  Please refrain from all sexual activity for at least the next seven days.

## 2020-11-22 ENCOUNTER — Telehealth (HOSPITAL_COMMUNITY): Payer: Self-pay | Admitting: Emergency Medicine

## 2020-11-22 ENCOUNTER — Telehealth (HOSPITAL_COMMUNITY): Payer: Self-pay | Admitting: Urgent Care

## 2020-11-22 NOTE — Telephone Encounter (Signed)
Pt called to ask that UC send in a medication to help with yeast infection after antibiotic. Contacted pt to advise that she would need to contact prescribing doctor in order to have something else called in as UC did not prescribe that medication.

## 2020-11-22 NOTE — Telephone Encounter (Signed)
Patient was seen by Dr. Tracie Harrier for possible HSV.  Per chart review does not appear that he prescribed any antibiotics.  Her chart does show that she was prescribed antibiotics by different physician at another facility.  Recommend patient recommendations regarding yeast infections with antibiotics.

## 2020-11-23 ENCOUNTER — Other Ambulatory Visit: Payer: Self-pay

## 2020-11-23 LAB — HSV CULTURE AND TYPING

## 2020-11-23 MED ORDER — FLUCONAZOLE 150 MG PO TABS
150.0000 mg | ORAL_TABLET | Freq: Once | ORAL | 0 refills | Status: AC
Start: 2020-11-23 — End: 2020-11-23

## 2020-11-23 NOTE — Progress Notes (Signed)
Pt reports vaginal itching and irritation.   Recent ABx use

## 2020-11-24 ENCOUNTER — Telehealth (HOSPITAL_COMMUNITY): Payer: Self-pay | Admitting: Emergency Medicine

## 2020-11-24 MED ORDER — VALACYCLOVIR HCL 1 G PO TABS
1000.0000 mg | ORAL_TABLET | Freq: Two times a day (BID) | ORAL | 0 refills | Status: AC
Start: 2020-11-24 — End: 2020-12-01

## 2020-11-25 ENCOUNTER — Telehealth (HOSPITAL_COMMUNITY): Payer: Self-pay | Admitting: Emergency Medicine

## 2020-11-25 MED ORDER — ACYCLOVIR 200 MG/5ML PO SUSP: 400.0000 mg | Freq: Three times a day (TID) | ORAL | 0 refills | Status: AC

## 2020-11-28 ENCOUNTER — Ambulatory Visit: Payer: Medicare Other

## 2020-12-22 ENCOUNTER — Ambulatory Visit (HOSPITAL_COMMUNITY): Payer: Self-pay

## 2020-12-23 ENCOUNTER — Ambulatory Visit (HOSPITAL_COMMUNITY): Payer: Self-pay

## 2021-01-23 ENCOUNTER — Encounter (HOSPITAL_COMMUNITY): Payer: Self-pay

## 2021-01-23 ENCOUNTER — Other Ambulatory Visit: Payer: Self-pay

## 2021-01-23 ENCOUNTER — Ambulatory Visit (HOSPITAL_COMMUNITY)
Admission: EM | Admit: 2021-01-23 | Discharge: 2021-01-23 | Disposition: A | Discharge status: Home / Self Care | Payer: Medicare Other | Attending: Student | Admitting: Student

## 2021-01-23 ENCOUNTER — Ambulatory Visit (HOSPITAL_COMMUNITY): Payer: Self-pay

## 2021-01-23 DIAGNOSIS — B9689 Other specified bacterial agents as the cause of diseases classified elsewhere: Secondary | ICD-10-CM | POA: Diagnosis present

## 2021-01-23 DIAGNOSIS — B3731 Acute candidiasis of vulva and vagina: Secondary | ICD-10-CM

## 2021-01-23 DIAGNOSIS — N760 Acute vaginitis: Secondary | ICD-10-CM | POA: Insufficient documentation

## 2021-01-23 DIAGNOSIS — B373 Candidiasis of vulva and vagina: Secondary | ICD-10-CM | POA: Insufficient documentation

## 2021-01-23 MED ORDER — FLUCONAZOLE 150 MG PO TABS
150.0000 mg | ORAL_TABLET | Freq: Every day | ORAL | 0 refills | Status: DC
Start: 2021-01-23 — End: 2021-04-03

## 2021-01-23 MED ORDER — METRONIDAZOLE 500 MG PO TABS
500.0000 mg | ORAL_TABLET | Freq: Two times a day (BID) | ORAL | 0 refills | Status: DC
Start: 2021-01-23 — End: 2021-04-03

## 2021-01-23 MED ORDER — ONDANSETRON 8 MG PO TBDP
8.0000 mg | ORAL_TABLET | Freq: Three times a day (TID) | ORAL | 0 refills | Status: DC | PRN
Start: 2021-01-23 — End: 2021-12-26

## 2021-01-23 NOTE — ED Provider Notes (Signed)
[facility]    CSN: [id number] Arrival date & time: 01/23/21  1606      History   Chief Complaint Chief Complaint  Patient presents with  . APPOINTMENT ; Vaginits     HPI Yolanda Payne is a 25 y.o. female presenting with vaginal discharge and odor for 3 weeks.  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  Prior to that, she tested positive for BV on 11/15/2020. Patient endorsees long history recurrent BV following her period. LMP 10 days ago.  Today with thin clear vaginal discharge, odor for about 3 weeks.  Denies other symptoms including vaginal lesions, irritation, rashes, dysuria, frequency, abdominal pain.  Denies new partners or STI risk. Denies hematuria, dysuria, frequency, urgency, back pain, n/v/d/abd pain, fevers/chills.   HPI  History reviewed. No pertinent past medical history.  Patient Active Problem List   Diagnosis Date Noted  . Itching in the vaginal area 10/18/2020    History reviewed. No pertinent surgical history.  OB History    Gravida  0   Para  0   Term  0   Preterm  0   AB  0   Living  0     SAB  0   IAB  0   Ectopic  0   Multiple  0   Live Births  0            Home Medications    Prior to Admission medications   Medication Sig Start Date End Date Taking? Authorizing Provider  fluconazole (DIFLUCAN) 150 MG tablet Take 1 tablet (150 mg total) by mouth daily. -For your yeast infection, start the Diflucan (fluconazole)- Take one pill on day one of symptoms (day 1). If you're still having symptoms in 3 days, take the second pill. 01/23/21  Yes Rhys Martini, PA-C  metroNIDAZOLE (FLAGYL) 500 MG tablet Take 1 tablet (500 mg total) by mouth 2 (two) times daily. 01/23/21  Yes Rhys Martini, PA-C  ondansetron (ZOFRAN ODT) 8 MG disintegrating tablet Take 1 tablet (8 mg total) by mouth every 8 (eight) hours as needed for nausea or vomiting. You can try  taking this medication at the same time as the Flagyl to help prevent nausea and vomiting. 01/23/21  Yes Rhys Martini, PA-C    Family History Family History  Problem Relation Age of Onset  . Healthy Mother   . Healthy Father   . Hypertension Father     Social History Social History   Tobacco Use  . Smoking status: Never Smoker  . Smokeless tobacco: Never Used  Vaping Use  . Vaping Use: Never used  Substance Use Topics  . Alcohol use: Not Currently  . Drug use: No     Allergies   Patient has no known allergies.   Review of Systems Review of Systems  Constitutional: Negative for appetite change, chills, diaphoresis and fever.  Respiratory: Negative for shortness of breath.   Cardiovascular: Negative for chest pain.  Gastrointestinal: Negative for abdominal pain, blood in stool, constipation, diarrhea, nausea and vomiting.  Genitourinary: Positive for vaginal discharge. Negative for decreased urine volume, difficulty urinating, dysuria, flank pain, frequency, genital sores, hematuria, urgency, vaginal bleeding and vaginal pain.  Musculoskeletal: Negative for back pain.  Neurological: Negative for dizziness, weakness and light-headedness.  All other systems reviewed and are negative.    Physical Exam Triage Vital Signs ED Triage Vitals  Enc  Vitals Group     BP 01/23/21 1642 (!) 161/93     Pulse Rate 01/23/21 1642 80     Resp 01/23/21 1642 18     Temp 01/23/21 1642 98.4 F (36.9 C)     Temp Source 01/23/21 1642 Oral     SpO2 01/23/21 1642 98 %     Weight --      Height --      Head Circumference --      Peak Flow --      Pain Score 01/23/21 1643 0     Pain Loc --      Pain Edu? --      Excl. in GC? --    No data found.  Updated Vital Signs BP (!) 161/93 (BP Location: Right Arm)   Pulse 80   Temp 98.4 F (36.9 C) (Oral)   Resp 18   LMP 01/03/2021   SpO2 98%   Visual Acuity Right Eye Distance:   Left Eye Distance:   Bilateral Distance:    Right  Eye Near:   Left Eye Near:    Bilateral Near:     Physical Exam Vitals reviewed.  Constitutional:      General: She is not in acute distress.    Appearance: Normal appearance. She is not ill-appearing.  HENT:     Head: Normocephalic and atraumatic.     Mouth/Throat:     Mouth: Mucous membranes are moist.     Comments: Moist mucous membranes Eyes:     Extraocular Movements: Extraocular movements intact.     Pupils: Pupils are equal, round, and reactive to light.  Cardiovascular:     Rate and Rhythm: Normal rate and regular rhythm.     Heart sounds: Normal heart sounds.  Pulmonary:     Effort: Pulmonary effort is normal.     Breath sounds: Normal breath sounds. No wheezing, rhonchi or rales.  Abdominal:     General: Bowel sounds are normal. There is no distension.     Palpations: Abdomen is soft. There is no mass.     Tenderness: There is no abdominal tenderness. There is no right CVA tenderness, left CVA tenderness, guarding or rebound.     Comments: deferred  Skin:    General: Skin is warm.     Capillary Refill: Capillary refill takes less than 2 seconds.     Comments: Good skin turgor  Neurological:     General: No focal deficit present.     Mental Status: She is alert and oriented to person, place, and time.  Psychiatric:        Mood and Affect: Mood normal.        Behavior: Behavior normal.        Thought Content: Thought content normal.        Judgment: Judgment normal.      UC Treatments / Results  Labs (all labs ordered are listed, but only abnormal results are displayed) Labs Reviewed  CERVICOVAGINAL ANCILLARY ONLY    EKG   Radiology No results found.  Procedures Procedures (including critical care time)  Medications Ordered in UC Medications - No data to display  Initial Impression / Assessment and Plan / UC Course  I have reviewed the triage vital signs and the nursing notes.  Pertinent labs & imaging results that were available during my  care of the patient were reviewed by me and considered in my medical decision making (see chart for details).  This patient is a 25 year old female presenting with suspected BV.  She is afebrile, nontachycardic, no abdominal pain or CVAT.  Plan to treat for BV with Flagyl as below.  Patient with history of nausea on this medication, so Zofran also sent.  Diflucan sent at patient request as she typically gets vaginal candidiasis while taking antibiotics.  Self swab sent for BV, yeast, gonorrhea, chlamydia, trichomonas.  Declines HIV, syphilis.  Safe sex precautions discussed.  ED return precautions discussed.  Coding this visit a level 4 given review of prior external notes (3/14 note), prior labs (3/14 and 3/8), ordering of each unique test (STI screen), prescription drug management.  Final Clinical Impressions(s) / UC Diagnoses   Final diagnoses:  Bacterial vaginosis  Vaginal candidiasis     Discharge Instructions     -Flagyl (metronidazole) twice daily for 7 days.  You can take this with food to help prevent nausea.  I also sent a nausea medication called Zofran (ondansetron), dissolve 1 pill under your tongue as needed for nausea and vomiting. -Diflucan for yeast. -We are screening for BV, yeast, gonorrhea, chlamydia, trichomonas.  We will call you if any additional labs come back as positive, and can send additional treatment at that time.  Avoid intercourse until negative results. -Seek additional medical attention if you develop new symptoms like new/worsening abdominal pain, vaginal discharge, fever/chills, etc.    ED Prescriptions    Medication Sig Dispense Auth. Provider   metroNIDAZOLE (FLAGYL) 500 MG tablet Take 1 tablet (500 mg total) by mouth 2 (two) times daily. 14 tablet Rhys Martini, PA-C   fluconazole (DIFLUCAN) 150 MG tablet Take 1 tablet (150 mg total) by mouth daily. -For your yeast infection, start the Diflucan (fluconazole)- Take one pill on day one of  symptoms (day 1). If you're still having symptoms in 3 days, take the second pill. 2 tablet Rhys Martini, PA-C   ondansetron (ZOFRAN ODT) 8 MG disintegrating tablet Take 1 tablet (8 mg total) by mouth every 8 (eight) hours as needed for nausea or vomiting. You can try taking this medication at the same time as the Flagyl to help prevent nausea and vomiting. 20 tablet Rhys Martini, PA-C     PDMP not reviewed this encounter.   Rhys Martini, PA-C 01/23/21 1756

## 2021-01-23 NOTE — ED Triage Notes (Signed)
Pt presents with vaginal odor and discharge X 3 weeks.

## 2021-01-23 NOTE — Discharge Instructions (Addendum)
-  Flagyl (metronidazole) twice daily for 7 days.  You can take this with food to help prevent nausea.  I also sent a nausea medication called Zofran (ondansetron), dissolve 1 pill under your tongue as needed for nausea and vomiting. -Diflucan for yeast. -We are screening for BV, yeast, gonorrhea, chlamydia, trichomonas.  We will call you if any additional labs come back as positive, and can send additional treatment at that time.  Avoid intercourse until negative results. -Seek additional medical attention if you develop new symptoms like new/worsening abdominal pain, vaginal discharge, fever/chills, etc.

## 2021-01-24 LAB — CERVICOVAGINAL ANCILLARY ONLY
Bacterial Vaginitis (gardnerella): NEGATIVE
Candida Glabrata: NEGATIVE
Candida Vaginitis: NEGATIVE
Chlamydia: NEGATIVE
Comment: NEGATIVE
Comment: NEGATIVE
Comment: NEGATIVE
Comment: NEGATIVE
Comment: NEGATIVE
Comment: NORMAL
Neisseria Gonorrhea: NEGATIVE
Trichomonas: NEGATIVE

## 2021-02-13 ENCOUNTER — Encounter (HOSPITAL_COMMUNITY): Payer: Self-pay

## 2021-02-13 ENCOUNTER — Ambulatory Visit (HOSPITAL_COMMUNITY)
Admission: RE | Admit: 2021-02-13 | Discharge: 2021-02-13 | Disposition: A | Discharge status: Home / Self Care | Payer: Medicare Other | Source: Ambulatory Visit | Attending: Emergency Medicine | Admitting: Emergency Medicine

## 2021-02-13 ENCOUNTER — Other Ambulatory Visit: Payer: Self-pay

## 2021-02-13 VITALS — BP 118/67 | HR 89 | Temp 98.4°F | Resp 18

## 2021-02-13 DIAGNOSIS — N760 Acute vaginitis: Secondary | ICD-10-CM | POA: Diagnosis present

## 2021-02-13 MED ORDER — VALACYCLOVIR HCL 1 G PO TABS: 1000.0000 mg | ORAL_TABLET | Freq: Two times a day (BID) | ORAL | 0 refills | Status: AC

## 2021-02-13 NOTE — ED Triage Notes (Signed)
Pt presents with recurring vaginal itching and burning X 2 days

## 2021-02-13 NOTE — Discharge Instructions (Addendum)
We will contact you with the results from your lab work and any additional treatment.    If you have an outbreak, take the Valtrex twice a day for the next 7-10 days.    Do not have sex while taking undergoing treatment for STI.  Make sure that all of your partners get tested and treated.   Use a condom or other barrier method for all sexual encounters.    Return or go to the Emergency Department if symptoms worsen or do not improve in the next few days.

## 2021-02-13 NOTE — ED Provider Notes (Signed)
[facility]    CSN: [id number] Arrival date & time: 02/13/21  1545      History   Chief Complaint Chief Complaint  Patient presents with  . APPOINTMENT:Vaginitis    HPI Yolanda Payne is a 25 y.o. female.   Patient here for evaluation of vaginal irritation and burning for the past 2 days.  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  Reports having a lesion about 1 month ago and took antivirals which helped to relieve symptoms.  Reports a clear discharge.  Denies any odor or pain.  Denies any dysuria, urgency, or frequency.  Denies any trauma, injury, or other precipitating event.  Denies any specific alleviating or aggravating factors.  Denies any fevers, chest pain, shortness of breath, N/V/D, numbness, tingling, weakness, abdominal pain, or headaches.       History reviewed. No pertinent past medical history.  Patient Active Problem List   Diagnosis Date Noted  . Itching in the vaginal area 10/18/2020    History reviewed. No pertinent surgical history.  OB History    Gravida  0   Para  0   Term  0   Preterm  0   AB  0   Living  0     SAB  0   IAB  0   Ectopic  0   Multiple  0   Live Births  0            Home Medications    Prior to Admission medications   Medication Sig Start Date End Date Taking? Authorizing Provider  valACYclovir (VALTREX) 1000 MG tablet Take 1 tablet (1,000 mg total) by mouth 2 (two) times daily for 10 days. 02/13/21 02/23/21 Yes Ivette Loyal, NP  fluconazole (DIFLUCAN) 150 MG tablet Take 1 tablet (150 mg total) by mouth daily. -For your yeast infection, start the Diflucan (fluconazole)- Take one pill on day one of symptoms (day 1). If you're still having symptoms in 3 days, take the second pill. 01/23/21   Rhys Martini, PA-C  metroNIDAZOLE (FLAGYL) 500 MG tablet Take 1 tablet (500 mg total) by mouth 2 (two) times daily. 01/23/21   Rhys Martini, PA-C  ondansetron  (ZOFRAN ODT) 8 MG disintegrating tablet Take 1 tablet (8 mg total) by mouth every 8 (eight) hours as needed for nausea or vomiting. You can try taking this medication at the same time as the Flagyl to help prevent nausea and vomiting. 01/23/21   Rhys Martini, PA-C    Family History Family History  Problem Relation Age of Onset  . Healthy Mother   . Healthy Father   . Hypertension Father     Social History Social History   Tobacco Use  . Smoking status: Never Smoker  . Smokeless tobacco: Never Used  Vaping Use  . Vaping Use: Never used  Substance Use Topics  . Alcohol use: Not Currently  . Drug use: No     Allergies   Patient has no known allergies.   Review of Systems Review of Systems  Genitourinary: Positive for vaginal discharge. Negative for dysuria, genital sores, pelvic pain, urgency, vaginal bleeding and vaginal pain.  All other systems reviewed and are negative.    Physical Exam Triage Vital Signs ED Triage Vitals  Enc Vitals Group     BP 02/13/21 1626 118/67     Pulse Rate 02/13/21 1626 89     Resp 02/13/21 1626 18  Temp 02/13/21 1626 98.4 F (36.9 C)     Temp Source 02/13/21 1626 Oral     SpO2 02/13/21 1626 100 %     Weight --      Height --      Head Circumference --      Peak Flow --      Pain Score 02/13/21 1624 5     Pain Loc --      Pain Edu? --      Excl. in GC? --    No data found.  Updated Vital Signs BP 118/67 (BP Location: Right Arm)   Pulse 89   Temp 98.4 F (36.9 C) (Oral)   Resp 18   LMP 01/31/2021   SpO2 100%   Visual Acuity Right Eye Distance:   Left Eye Distance:   Bilateral Distance:    Right Eye Near:   Left Eye Near:    Bilateral Near:     Physical Exam Vitals and nursing note reviewed.  Constitutional:      General: She is not in acute distress.    Appearance: Normal appearance. She is not ill-appearing, toxic-appearing or diaphoretic.  HENT:     Head: Normocephalic and atraumatic.  Eyes:      Conjunctiva/sclera: Conjunctivae normal.  Cardiovascular:     Rate and Rhythm: Normal rate.     Pulses: Normal pulses.  Pulmonary:     Effort: Pulmonary effort is normal.  Abdominal:     General: Abdomen is flat.  Genitourinary:    Comments: declines Musculoskeletal:        General: Normal range of motion.     Cervical back: Normal range of motion.  Skin:    General: Skin is warm and dry.  Neurological:     General: No focal deficit present.     Mental Status: She is alert and oriented to person, place, and time.  Psychiatric:        Mood and Affect: Mood normal.      UC Treatments / Results  Labs (all labs ordered are listed, but only abnormal results are displayed) Labs Reviewed  CERVICOVAGINAL ANCILLARY ONLY    EKG   Radiology No results found.  Procedures Procedures (including critical care time)  Medications Ordered in UC Medications - No data to display  Initial Impression / Assessment and Plan / UC Course  I have reviewed the triage vital signs and the nursing notes.  Pertinent labs & imaging results that were available during my care of the patient were reviewed by me and considered in my medical decision making (see chart for details).    Assessment negative for red flags or concerns.  Self swab obtained and will treat based on results.  Valtrex refill sent and encouraged patient to start taking medications at the first sign of an outbreak and take medication for 7-10 days.  Discussed safe sex practices including condom or other barrier method use.  Follow up with primary care as needed.    Final Clinical Impressions(s) / UC Diagnoses   Final diagnoses:  Acute vaginitis     Discharge Instructions     We will contact you with the results from your lab work and any additional treatment.    If you have an outbreak, take the Valtrex twice a day for the next 7-10 days.    Do not have sex while taking undergoing treatment for STI.  Make sure that all  of your partners get tested and treated.   Use  a condom or other barrier method for all sexual encounters.    Return or go to the Emergency Department if symptoms worsen or do not improve in the next few days.     ED Prescriptions    Medication Sig Dispense Auth. Provider   valACYclovir (VALTREX) 1000 MG tablet Take 1 tablet (1,000 mg total) by mouth 2 (two) times daily for 10 days. 20 tablet Ivette Loyal, NP     PDMP not reviewed this encounter.   Ivette Loyal, NP 02/13/21 [phone]

## 2021-02-14 LAB — CERVICOVAGINAL ANCILLARY ONLY
Bacterial Vaginitis (gardnerella): NEGATIVE
Candida Glabrata: NEGATIVE
Candida Vaginitis: NEGATIVE
Chlamydia: NEGATIVE
Comment: NEGATIVE
Comment: NEGATIVE
Comment: NEGATIVE
Comment: NEGATIVE
Comment: NEGATIVE
Comment: NORMAL
Neisseria Gonorrhea: NEGATIVE
Trichomonas: NEGATIVE

## 2021-03-19 ENCOUNTER — Ambulatory Visit (HOSPITAL_COMMUNITY): Payer: Medicare Other

## 2021-03-19 ENCOUNTER — Ambulatory Visit (HOSPITAL_COMMUNITY)
Admission: EM | Admit: 2021-03-19 | Discharge: 2021-03-19 | Disposition: A | Discharge status: Home / Self Care | Payer: Medicare Other | Attending: Internal Medicine | Admitting: Internal Medicine

## 2021-03-19 ENCOUNTER — Encounter (HOSPITAL_COMMUNITY): Payer: Self-pay

## 2021-03-19 ENCOUNTER — Other Ambulatory Visit: Payer: Self-pay

## 2021-03-19 DIAGNOSIS — H6501 Acute serous otitis media, right ear: Secondary | ICD-10-CM | POA: Insufficient documentation

## 2021-03-19 DIAGNOSIS — A601 Herpesviral infection of perianal skin and rectum: Secondary | ICD-10-CM | POA: Diagnosis present

## 2021-03-19 DIAGNOSIS — N898 Other specified noninflammatory disorders of vagina: Secondary | ICD-10-CM | POA: Insufficient documentation

## 2021-03-19 LAB — POCT URINALYSIS DIPSTICK, ED / UC
Bilirubin Urine: NEGATIVE
Glucose, UA: NEGATIVE mg/dL
Hgb urine dipstick: NEGATIVE
Ketones, ur: NEGATIVE mg/dL
Leukocytes,Ua: NEGATIVE
Nitrite: NEGATIVE
Protein, ur: NEGATIVE mg/dL
Specific Gravity, Urine: 1.025 (ref 1.005–1.030)
Urobilinogen, UA: 0.2 mg/dL (ref 0.0–1.0)
pH: 6 (ref 5.0–8.0)

## 2021-03-19 LAB — POC URINE PREG, ED: Preg Test, Ur: NEGATIVE

## 2021-03-19 MED ORDER — VALACYCLOVIR HCL 1 G PO TABS: 500.0000 mg | ORAL_TABLET | Freq: Two times a day (BID) | ORAL | 0 refills | Status: DC

## 2021-03-19 MED ORDER — AMOXICILLIN-POT CLAVULANATE 875-125 MG PO TABS: 1.0000 | ORAL_TABLET | Freq: Two times a day (BID) | ORAL | 0 refills | Status: DC

## 2021-03-19 NOTE — ED Triage Notes (Signed)
C/o right ear pain since Wednesday.  Pt also c/o 2 small bumps near anal area which she first noticed about Wednesday. States bumps get irritated when they are rubbed against.  Pt also c/o vaginal odor/discharge starting about Wednesday, lower abdominal pain starting last night.

## 2021-03-19 NOTE — ED Provider Notes (Signed)
[facility]    CSN: [id number] Arrival date & time: 03/19/21  1042      History   Chief Complaint Chief Complaint  Patient presents with   Otalgia   bumps/anal area   Abdominal Pain   Vaginal Discharge    HPI Shanaya Schneck is a 25 y.o. female.   Patient presents with 2 bumps on the left side of her buttocks first noticed 2 days ago unsure if related to history of herpes simplex infection or shaving of area.  Bumps become irritated when brushed against.  Also concerned with clear to light tan vaginal discharge and odor noticed 4 days ago with associated lower abdominal pressure.  Denies frequency, urgency, hematuria, vaginal itching or pain, flank pain.   Concern with right ear pain, worsening when lying on side and itching beginning 4 days ago.  Endorses possible swollen lymph nodes on the right side as well.  Able to tolerate food and liquids.  Denies decreased hearing, discharge, fever, headaches, nausea, vomiting, diarrhea, nasal congestion, rhinorrhea, shortness of breath, chest pain or tightness, wheezing, Chills, body aches.  No known sick contacts.   History reviewed. No pertinent past medical history.  Patient Active Problem List   Diagnosis Date Noted   Itching in the vaginal area 10/18/2020    History reviewed. No pertinent surgical history.  OB History     Gravida  0   Para  0   Term  0   Preterm  0   AB  0   Living  0      SAB  0   IAB  0   Ectopic  0   Multiple  0   Live Births  0            Home Medications    Prior to Admission medications   Medication Sig Start Date End Date Taking? Authorizing Provider  amoxicillin-clavulanate (AUGMENTIN) 875-125 MG tablet Take 1 tablet by mouth every 12 (twelve) hours. 03/19/21  Yes Margert Edsall R, NP  ondansetron (ZOFRAN ODT) 8 MG disintegrating tablet Take 1 tablet (8 mg total) by mouth every 8 (eight) hours as needed for nausea or vomiting. You can try taking this  medication at the same time as the Flagyl to help prevent nausea and vomiting. 01/23/21  Yes Rhys Martini, PA-C  valACYclovir (VALTREX) 1000 MG tablet Take 0.5 tablets (500 mg total) by mouth 2 (two) times daily. 03/19/21  Yes Xylia Scherger, Elita Boone, NP  fluconazole (DIFLUCAN) 150 MG tablet Take 1 tablet (150 mg total) by mouth daily. -For your yeast infection, start the Diflucan (fluconazole)- Take one pill on day one of symptoms (day 1). If you're still having symptoms in 3 days, take the second pill. 01/23/21   Rhys Martini, PA-C  metroNIDAZOLE (FLAGYL) 500 MG tablet Take 1 tablet (500 mg total) by mouth 2 (two) times daily. 01/23/21   Rhys Martini, PA-C    Family History Family History  Problem Relation Age of Onset   Healthy Mother    Healthy Father    Hypertension Father     Social History Social History   Tobacco Use   Smoking status: Never   Smokeless tobacco: Never  Vaping Use   Vaping Use: Never used  Substance Use Topics   Alcohol use: Not Currently   Drug use: No     Allergies   Patient has no known allergies.   Review of Systems Review of Systems Deferred HPI   Physical  Exam Triage Vital Signs ED Triage Vitals  Enc Vitals Group     BP 03/19/21 1132 116/65     Pulse Rate 03/19/21 1132 62     Resp 03/19/21 1132 18     Temp 03/19/21 1132 98.9 F (37.2 C)     Temp Source 03/19/21 1132 Oral     SpO2 03/19/21 1132 100 %     Weight --      Height --      Head Circumference --      Peak Flow --      Pain Score 03/19/21 1130 0     Pain Loc --      Pain Edu? --      Excl. in GC? --    No data found.  Updated Vital Signs BP 116/65 (BP Location: Left Arm)   Pulse 62   Temp 98.9 F (37.2 C) (Oral)   Resp 18   LMP 02/28/2021   SpO2 100%   Visual Acuity Right Eye Distance:   Left Eye Distance:   Bilateral Distance:    Right Eye Near:   Left Eye Near:    Bilateral Near:     Physical Exam Constitutional:      Appearance: Normal appearance.  She is obese.  HENT:     Head: Normocephalic.     Right Ear: Hearing, ear canal and external ear normal. Tympanic membrane is erythematous.     Left Ear: Tympanic membrane, ear canal and external ear normal.     Nose: Nose normal.     Mouth/Throat:     Mouth: Mucous membranes are moist.     Pharynx: No posterior oropharyngeal erythema.     Tonsils: No tonsillar abscesses. 0 on the right. 1+ on the left.      Comments: Small tonsil stone present on the left side Eyes:     Extraocular Movements: Extraocular movements intact.  Cardiovascular:     Rate and Rhythm: Normal rate and regular rhythm.     Pulses: Normal pulses.     Heart sounds: Normal heart sounds.  Pulmonary:     Effort: Pulmonary effort is normal.  Abdominal:     General: Abdomen is flat.     Palpations: Abdomen is soft.     Tenderness: There is abdominal tenderness in the suprapubic area. There is no right CVA tenderness or left CVA tenderness.  Genitourinary:      Comments: Two 1 cm blisters present on right side lateral to the anus, self collected vaginal swab Musculoskeletal:     Cervical back: Normal range of motion and neck supple.  Lymphadenopathy:     Cervical: No cervical adenopathy.  Skin:    General: Skin is warm and dry.  Neurological:     Mental Status: She is alert and oriented to person, place, and time. Mental status is at baseline.  Psychiatric:        Mood and Affect: Mood normal.        Behavior: Behavior normal.     UC Treatments / Results  Labs (all labs ordered are listed, but only abnormal results are displayed) Labs Reviewed  POCT URINALYSIS DIPSTICK, ED / UC  POC URINE PREG, ED  CERVICOVAGINAL ANCILLARY ONLY    EKG   Radiology No results found.  Procedures Procedures (including critical care time)  Medications Ordered in UC Medications - No data to display  Initial Impression / Assessment and Plan / UC Course  I have reviewed the triage vital  signs and the nursing  notes.  Pertinent labs & imaging results that were available during my care of the patient were reviewed by me and considered in my medical decision making (see chart for details).  Otitis media right ear Herpes simplex infection of the rectal  1.  Augmentin 875-125 twice daily for 7 days 2.  Advise to stop cleaning ears with Q-tips and to switch to over-the-counter Debrox drops 3.  Valacyclovir 500 mg twice daily for 3 days, educated patient on transmission of virus 4.  STI screening pending, will treat per protocol, advised refraining from sex until labs results and all symptoms have resolved and treatment are complete Final Clinical Impressions(s) / UC Diagnoses   Final diagnoses:  Non-recurrent acute serous otitis media of right ear  Herpes simplex infection of rectum     Discharge Instructions      Take antibiotic twice a day for the next 7 days, please stop using Q-tips to clean your ears this for any fissures or backs down in size, if morning to keep ears clean over-the-counter Debrox drops 3 to 5 drops in the ear with exit for 5 minutes remain can use cotton ball to remove\  Take valacyclovir twice a day for the next 3 days  For prevention of transmission of infection, it is higher risk for transmission during periods of outbreak however there is always a possibility of transmission of infection when no outbreak is present therefore it is important that you inform your partner of possibility of exposure and use forms of protection such as condoms or dental exams  STI screening pending 2 to 3 days, you will be called if positive and treatment will be sent to the pharmacy, you only have to return to clinic if positive for gonorrhea  Please refrain from having sex until labs result and/or treatment is complete and symptoms have resolved   ED Prescriptions     Medication Sig Dispense Auth. Provider   amoxicillin-clavulanate (AUGMENTIN) 875-125 MG tablet Take 1 tablet by mouth  every 12 (twelve) hours. 14 tablet Lessie Funderburke R, NP   valACYclovir (VALTREX) 1000 MG tablet Take 0.5 tablets (500 mg total) by mouth 2 (two) times daily. 6 tablet Valinda Hoar, NP      PDMP not reviewed this encounter.   Valinda Hoar, NP 03/19/21 1226

## 2021-03-19 NOTE — Discharge Instructions (Addendum)
Take antibiotic twice a day for the next 7 days, please stop using Q-tips to clean your ears this for any fissures or backs down in size, if morning to keep ears clean over-the-counter Debrox drops 3 to 5 drops in the ear with exit for 5 minutes remain can use cotton ball to remove\  Take valacyclovir twice a day for the next 3 days  For prevention of transmission of infection, it is higher risk for transmission during periods of outbreak however there is always a possibility of transmission of infection when no outbreak is present therefore it is important that you inform your partner of possibility of exposure and use forms of protection such as condoms or dental exams  STI screening pending 2 to 3 days, you will be called if positive and treatment will be sent to the pharmacy, you only have to return to clinic if positive for gonorrhea  Please refrain from having sex until labs result and/or treatment is complete and symptoms have resolved

## 2021-03-20 LAB — CERVICOVAGINAL ANCILLARY ONLY
Bacterial Vaginitis (gardnerella): NEGATIVE
Candida Glabrata: NEGATIVE
Candida Vaginitis: NEGATIVE
Chlamydia: NEGATIVE
Comment: NEGATIVE
Comment: NEGATIVE
Comment: NEGATIVE
Comment: NEGATIVE
Comment: NEGATIVE
Comment: NORMAL
Neisseria Gonorrhea: NEGATIVE
Trichomonas: NEGATIVE

## 2021-04-03 ENCOUNTER — Encounter (HOSPITAL_COMMUNITY): Payer: Self-pay

## 2021-04-03 ENCOUNTER — Ambulatory Visit (HOSPITAL_COMMUNITY)
Admission: RE | Admit: 2021-04-03 | Discharge: 2021-04-03 | Disposition: A | Discharge status: Home / Self Care | Payer: Medicare Other | Source: Ambulatory Visit | Attending: Physician Assistant | Admitting: Physician Assistant

## 2021-04-03 ENCOUNTER — Other Ambulatory Visit: Payer: Self-pay

## 2021-04-03 VITALS — BP 125/78 | HR 72 | Temp 98.7°F | Resp 18

## 2021-04-03 DIAGNOSIS — L739 Follicular disorder, unspecified: Secondary | ICD-10-CM | POA: Diagnosis not present

## 2021-04-03 MED ORDER — CEPHALEXIN 500 MG PO CAPS: 500.0000 mg | ORAL_CAPSULE | Freq: Two times a day (BID) | ORAL | 0 refills | Status: AC

## 2021-04-03 MED ORDER — MUPIROCIN 2 % EX OINT: 1.0000 "application " | TOPICAL_OINTMENT | Freq: Every day | CUTANEOUS | 0 refills | Status: DC

## 2021-04-03 NOTE — Discharge Instructions (Addendum)
I believe this lesion is the beginning of an infection.  Start Keflex twice daily for 5 days.  Use Bactroban at least once daily.  Use warm compresses on this area.  You can alternate Tylenol and ibuprofen for pain relief.  If this area becomes significantly larger and more painful I would return as we can consider draining it.  If you develop any fever, nausea, vomiting, additional lesions you need to be reevaluated.

## 2021-04-03 NOTE — ED Triage Notes (Signed)
Pt in with c/o bump on her vagina  Denies any pain

## 2021-04-03 NOTE — ED Provider Notes (Signed)
[facility]    CSN: [id number] Arrival date & time: 04/03/21  0945      History   Chief Complaint Chief Complaint  Patient presents with   Vaginal bump    HPI Yolanda Payne is a 25 y.o. female.   Patient presents today with a weeklong history of painful lump in genital region.  She has a history of herpes and states current symptoms are similar to her previous outbreaks.  She reports 1 painful lesion that has been growing in size on her right lateral mons pubis.  She has been treated with Valtrex in the past but does not have access to this medication. She has not been on Valtrex prophylactically.  She has no concern for additional STIs and declined additional testing today.  Denies any changes to personal hygiene products including soaps or detergents.  She has not shaved recently denies history of folliculitis or recurrent skin infections.  Denies additional symptoms including abdominal pain, fever, nausea, vomiting.  She has no concern for pregnancy.   History reviewed. No pertinent past medical history.  Patient Active Problem List   Diagnosis Date Noted   Itching in the vaginal area 10/18/2020    History reviewed. No pertinent surgical history.  OB History     Gravida  0   Para  0   Term  0   Preterm  0   AB  0   Living  0      SAB  0   IAB  0   Ectopic  0   Multiple  0   Live Births  0            Home Medications    Prior to Admission medications   Medication Sig Start Date End Date Taking? Authorizing Provider  cephALEXin (KEFLEX) 500 MG capsule Take 1 capsule (500 mg total) by mouth 2 (two) times daily for 5 days. 04/03/21 04/08/21 Yes Fynley Chrystal, Noberto Retort, PA-C  mupirocin ointment (BACTROBAN) 2 % Apply 1 application topically daily. 04/03/21  Yes Deshaun Weisinger K, PA-C  ondansetron (ZOFRAN ODT) 8 MG disintegrating tablet Take 1 tablet (8 mg total) by mouth every 8 (eight) hours as needed for nausea or vomiting. You can try taking this  medication at the same time as the Flagyl to help prevent nausea and vomiting. 01/23/21   Rhys Martini, PA-C    Family History Family History  Problem Relation Age of Onset   Healthy Mother    Healthy Father    Hypertension Father     Social History Social History   Tobacco Use   Smoking status: Never   Smokeless tobacco: Never  Vaping Use   Vaping Use: Never used  Substance Use Topics   Alcohol use: Not Currently   Drug use: No     Allergies   Patient has no known allergies.   Review of Systems Review of Systems  Constitutional:  Negative for activity change, appetite change, fatigue and fever.  Respiratory:  Negative for cough and shortness of breath.   Cardiovascular:  Negative for chest pain.  Gastrointestinal:  Negative for abdominal pain, diarrhea, nausea and vomiting.  Genitourinary:  Positive for genital sores. Negative for dysuria, frequency, urgency, vaginal bleeding, vaginal discharge and vaginal pain.  Neurological:  Negative for dizziness, light-headedness and headaches.    Physical Exam Triage Vital Signs ED Triage Vitals  Enc Vitals Group     BP 04/03/21 1016 125/78     Pulse Rate 04/03/21 1016  72     Resp 04/03/21 1016 18     Temp 04/03/21 1017 98.7 F (37.1 C)     Temp Source 04/03/21 1016 Oral     SpO2 04/03/21 1016 100 %     Weight --      Height --      Head Circumference --      Peak Flow --      Pain Score 04/03/21 1015 0     Pain Loc --      Pain Edu? --      Excl. in GC? --    No data found.  Updated Vital Signs BP 125/78 (BP Location: Right Arm)   Pulse 72   Temp 98.7 F (37.1 C) (Oral)   Resp 18   LMP 03/26/2021 (Approximate)   SpO2 100%   Visual Acuity Right Eye Distance:   Left Eye Distance:   Bilateral Distance:    Right Eye Near:   Left Eye Near:    Bilateral Near:     Physical Exam Vitals reviewed.  Constitutional:      General: She is awake. She is not in acute distress.    Appearance: Normal  appearance. She is normal weight. She is not ill-appearing.     Comments: Very pleasant female appears stated age in no acute distress  HENT:     Head: Normocephalic and atraumatic.  Cardiovascular:     Rate and Rhythm: Normal rate and regular rhythm.     Heart sounds: Normal heart sounds, S1 normal and S2 normal. No murmur heard. Pulmonary:     Effort: Pulmonary effort is normal.     Breath sounds: Normal breath sounds. No wheezing, rhonchi or rales.     Comments: Clear to auscultation bilaterally Abdominal:     General: Bowel sounds are normal.     Palpations: Abdomen is soft.     Tenderness: There is no abdominal tenderness. There is no right CVA tenderness, left CVA tenderness, guarding or rebound.     Comments: Benign abdominal exam  Genitourinary:    Labia:        Right: No rash or tenderness.        Left: No rash or tenderness.      Comments: 1 cm nodule noted right superior mons pubis.  Area is tender to touch.  No streaking or evidence of lymphangitis.  Significant induration without fluctuance. Psychiatric:        Behavior: Behavior is cooperative.     UC Treatments / Results  Labs (all labs ordered are listed, but only abnormal results are displayed) Labs Reviewed - No data to display  EKG   Radiology No results found.  Procedures Procedures (including critical care time)  Medications Ordered in UC Medications - No data to display  Initial Impression / Assessment and Plan / UC Course  I have reviewed the triage vital signs and the nursing notes.  Pertinent labs & imaging results that were available during my care of the patient were reviewed by me and considered in my medical decision making (see chart for details).     No ulcerated or vesicular lesions on exam.  Discussed symptoms are likely related to folliculitis patient was started on Keflex twice daily for 5 days.  She does not take birth control but has no concern for pregnancy as LMP within the last  week.  She was prescribed Bactroban to be applied 1-2 times daily with dressing changes.  Recommended she use warm compresses.  She can alternate over-the-counter medications such as Tylenol and ibuprofen for pain.  Discussed that if area enlarges or becomes more painful she should return for further evaluation.  Strict return precautions given to which patient expressed understanding.  Final Clinical Impressions(s) / UC Diagnoses   Final diagnoses:  Folliculitis     Discharge Instructions      I believe this lesion is the beginning of an infection.  Start Keflex twice daily for 5 days.  Use Bactroban at least once daily.  Use warm compresses on this area.  You can alternate Tylenol and ibuprofen for pain relief.  If this area becomes significantly larger and more painful I would return as we can consider draining it.  If you develop any fever, nausea, vomiting, additional lesions you need to be reevaluated.     ED Prescriptions     Medication Sig Dispense Auth. Provider   cephALEXin (KEFLEX) 500 MG capsule Take 1 capsule (500 mg total) by mouth 2 (two) times daily for 5 days. 10 capsule Cleona Doubleday K, PA-C   mupirocin ointment (BACTROBAN) 2 % Apply 1 application topically daily. 22 g Keiona Jenison K, PA-C      PDMP not reviewed this encounter.   Jeani Hawking, PA-C 04/03/21 1047

## 2021-04-12 ENCOUNTER — Encounter (HOSPITAL_COMMUNITY): Payer: Self-pay

## 2021-04-12 ENCOUNTER — Ambulatory Visit (HOSPITAL_COMMUNITY)
Admission: RE | Admit: 2021-04-12 | Discharge: 2021-04-12 | Disposition: A | Discharge status: Home / Self Care | Payer: Medicare Other | Source: Ambulatory Visit | Attending: Student | Admitting: Student

## 2021-04-12 ENCOUNTER — Other Ambulatory Visit: Payer: Self-pay

## 2021-04-12 VITALS — BP 115/79 | HR 75 | Temp 98.5°F | Resp 16

## 2021-04-12 DIAGNOSIS — N760 Acute vaginitis: Secondary | ICD-10-CM | POA: Diagnosis present

## 2021-04-12 DIAGNOSIS — B009 Herpesviral infection, unspecified: Secondary | ICD-10-CM | POA: Diagnosis present

## 2021-04-12 DIAGNOSIS — B9689 Other specified bacterial agents as the cause of diseases classified elsewhere: Secondary | ICD-10-CM | POA: Diagnosis present

## 2021-04-12 MED ORDER — VALACYCLOVIR HCL 500 MG PO TABS: 500.0000 mg | ORAL_TABLET | Freq: Two times a day (BID) | ORAL | 0 refills | Status: AC

## 2021-04-12 MED ORDER — METRONIDAZOLE 500 MG PO TABS: 500.0000 mg | ORAL_TABLET | Freq: Two times a day (BID) | ORAL | 0 refills | Status: DC

## 2021-04-12 NOTE — ED Provider Notes (Signed)
[facility]    CSN: [id number] Arrival date & time: 04/12/21  0258      History   Chief Complaint Chief Complaint  Patient presents with   Vaginal Itching    HPI Yolanda Payne is a 25 y.o. female presenting with vaginal itching and discharge for 3 days.  Medical history genital herpes and BV.  Describes external genital itching with few bumps consistent with her past genital herpes outbreaks.  Also notes thick gray discharge with some external vaginal itching.  Last genital herpes was 1 month ago and resolved with Valtrex.  States her symptoms are also consistent with her past BV.  Denies recent antibiotics.  Denies new partners or STI risk. Denies hematuria, dysuria, frequency, urgency, back pain, n/v/d/abd pain, fevers/chills.     HPI  History reviewed. No pertinent past medical history.  Patient Active Problem List   Diagnosis Date Noted   Itching in the vaginal area 10/18/2020    History reviewed. No pertinent surgical history.  OB History     Gravida  0   Para  0   Term  0   Preterm  0   AB  0   Living  0      SAB  0   IAB  0   Ectopic  0   Multiple  0   Live Births  0            Home Medications    Prior to Admission medications   Medication Sig Start Date End Date Taking? Authorizing Provider  metroNIDAZOLE (FLAGYL) 500 MG tablet Take 1 tablet (500 mg total) by mouth 2 (two) times daily. 04/12/21  Yes Rhys Martini, PA-C  valACYclovir (VALTREX) 500 MG tablet Take 1 tablet (500 mg total) by mouth 2 (two) times daily for 3 days. 04/12/21 04/15/21 Yes Rhys Martini, PA-C  mupirocin ointment (BACTROBAN) 2 % Apply 1 application topically daily. 04/03/21   Raspet, Erin K, PA-C  ondansetron (ZOFRAN ODT) 8 MG disintegrating tablet Take 1 tablet (8 mg total) by mouth every 8 (eight) hours as needed for nausea or vomiting. You can try taking this medication at the same time as the Flagyl to help prevent nausea and vomiting. 01/23/21    Rhys Martini, PA-C    Family History Family History  Problem Relation Age of Onset   Healthy Mother    Healthy Father    Hypertension Father     Social History Social History   Tobacco Use   Smoking status: Never   Smokeless tobacco: Never  Vaping Use   Vaping Use: Never used  Substance Use Topics   Alcohol use: Not Currently   Drug use: No     Allergies   Patient has no known allergies.   Review of Systems Review of Systems  Constitutional:  Negative for chills and fever.  HENT:  Negative for sore throat.   Eyes:  Negative for pain and redness.  Respiratory:  Negative for shortness of breath.   Cardiovascular:  Negative for chest pain.  Gastrointestinal:  Negative for abdominal pain, diarrhea, nausea and vomiting.  Genitourinary:  Positive for genital sores and vaginal discharge. Negative for decreased urine volume, difficulty urinating, dysuria, flank pain, frequency, hematuria, menstrual problem, pelvic pain, urgency, vaginal bleeding and vaginal pain.  Musculoskeletal:  Negative for back pain.  Skin:  Negative for rash.    Physical Exam Triage Vital Signs ED Triage Vitals  Enc Vitals Group     BP  Pulse      Resp      Temp      Temp src      SpO2      Weight      Height      Head Circumference      Peak Flow      Pain Score      Pain Loc      Pain Edu?      Excl. in GC?    No data found.  Updated Vital Signs BP 115/79 (BP Location: Left Arm)   Pulse 75   Temp 98.5 F (36.9 C) (Oral)   Resp 16   LMP 03/26/2021 (Approximate)   SpO2 97%   Visual Acuity Right Eye Distance:   Left Eye Distance:   Bilateral Distance:    Right Eye Near:   Left Eye Near:    Bilateral Near:     Physical Exam Vitals reviewed.  Constitutional:      General: She is not in acute distress.    Appearance: Normal appearance. She is not ill-appearing.  HENT:     Head: Normocephalic and atraumatic.  Cardiovascular:     Rate and Rhythm: Normal rate and  regular rhythm.     Heart sounds: Normal heart sounds.  Pulmonary:     Effort: Pulmonary effort is normal.     Breath sounds: Normal breath sounds.  Abdominal:     General: Bowel sounds are normal. There is no distension.     Palpations: Abdomen is soft. There is no mass.     Tenderness: There is no abdominal tenderness. There is no right CVA tenderness, left CVA tenderness, guarding or rebound. Negative signs include Murphy's sign, Rovsing's sign and McBurney's sign.  Genitourinary:    Comments: Deferred  Neurological:     General: No focal deficit present.     Mental Status: She is alert and oriented to person, place, and time. Mental status is at baseline.  Psychiatric:        Mood and Affect: Mood normal.        Behavior: Behavior normal.        Thought Content: Thought content normal.        Judgment: Judgment normal.     UC Treatments / Results  Labs (all labs ordered are listed, but only abnormal results are displayed) Labs Reviewed  CERVICOVAGINAL ANCILLARY ONLY    EKG   Radiology No results found.  Procedures Procedures (including critical care time)  Medications Ordered in UC Medications - No data to display  Initial Impression / Assessment and Plan / UC Course  I have reviewed the triage vital signs and the nursing notes.  Pertinent labs & imaging results that were available during my care of the patient were reviewed by me and considered in my medical decision making (see chart for details).     This patient is a very pleasant 25 y.o. year old female presenting with genital herpes outbreak and suspected BV. Afebrile, nontachy, no abd pain or CVAT. Last positive for BV 11/2020. Last herpes outbreak 1 month ago.  Valtrex as below Flagyl as below- she will only start this if cervicovaginal is positive Denies STI risk. Self swab sent for G/C, trich, yeast, BV testing.   ED return precautions discussed. Patient verbalizes understanding and agreement.    Coding this visit a level 4 for review of past notes and labs, order and interpretation of labs today, and prescription drug management.  Final Clinical  Impressions(s) / UC Diagnoses   Final diagnoses:  Herpes simplex  Bacterial vaginosis     Discharge Instructions      -I've sent a prescription for Flagyl for bacterial vaginosis. If your test comes back as positive you can start the antibiotic-Flagyl (metronidazole), 2 pills daily for 7 days.  You can take this with food if you have a sensitive stomach.  Avoid alcohol while taking this medication and for 2 days after as this will cause severe nausea and vomiting. -For herpes simplex (genital herpes), take the Valtrex twice daily for 3 days.      ED Prescriptions     Medication Sig Dispense Auth. Provider   valACYclovir (VALTREX) 500 MG tablet Take 1 tablet (500 mg total) by mouth 2 (two) times daily for 3 days. 6 tablet Rhys Martini, PA-C   metroNIDAZOLE (FLAGYL) 500 MG tablet Take 1 tablet (500 mg total) by mouth 2 (two) times daily. 14 tablet Rhys Martini, PA-C      PDMP not reviewed this encounter.   Rhys Martini, PA-C 04/12/21 [phone]

## 2021-04-12 NOTE — Discharge Instructions (Addendum)
-  I've sent a prescription for Flagyl for bacterial vaginosis. If your test comes back as positive you can start the antibiotic-Flagyl (metronidazole), 2 pills daily for 7 days.  You can take this with food if you have a sensitive stomach.  Avoid alcohol while taking this medication and for 2 days after as this will cause severe nausea and vomiting. -For herpes simplex (genital herpes), take the Valtrex twice daily for 3 days.

## 2021-04-12 NOTE — ED Triage Notes (Signed)
Pt presents with vaginal irritation. States is having a herpes flare up.

## 2021-04-13 LAB — CERVICOVAGINAL ANCILLARY ONLY
Bacterial Vaginitis (gardnerella): NEGATIVE
Candida Glabrata: NEGATIVE
Candida Vaginitis: NEGATIVE
Chlamydia: NEGATIVE
Comment: NEGATIVE
Comment: NEGATIVE
Comment: NEGATIVE
Comment: NEGATIVE
Comment: NEGATIVE
Comment: NORMAL
Neisseria Gonorrhea: NEGATIVE
Trichomonas: NEGATIVE

## 2021-05-17 ENCOUNTER — Ambulatory Visit (HOSPITAL_COMMUNITY)
Admission: RE | Admit: 2021-05-17 | Discharge: 2021-05-17 | Disposition: A | Discharge status: Home / Self Care | Payer: Medicare Other | Source: Ambulatory Visit | Attending: Emergency Medicine | Admitting: Emergency Medicine

## 2021-05-17 ENCOUNTER — Encounter (HOSPITAL_COMMUNITY): Payer: Self-pay

## 2021-05-17 ENCOUNTER — Other Ambulatory Visit: Payer: Self-pay

## 2021-05-17 VITALS — BP 119/82 | HR 64 | Temp 98.3°F | Resp 16

## 2021-05-17 DIAGNOSIS — A6000 Herpesviral infection of urogenital system, unspecified: Secondary | ICD-10-CM | POA: Diagnosis not present

## 2021-05-17 DIAGNOSIS — M545 Low back pain, unspecified: Secondary | ICD-10-CM | POA: Diagnosis not present

## 2021-05-17 DIAGNOSIS — M62830 Muscle spasm of back: Secondary | ICD-10-CM | POA: Diagnosis not present

## 2021-05-17 MED ORDER — VALACYCLOVIR HCL 1 G PO TABS: 1000.0000 mg | ORAL_TABLET | Freq: Every day | ORAL | 1 refills | Status: AC

## 2021-05-17 MED ORDER — CYCLOBENZAPRINE HCL 5 MG PO TABS: 5.0000 mg | ORAL_TABLET | Freq: Three times a day (TID) | ORAL | 0 refills | Status: AC | PRN

## 2021-05-17 NOTE — Discharge Instructions (Addendum)
Take meds as directed. Please follow up with PCP. May use biofreeze, salonpas, etc.

## 2021-05-17 NOTE — ED Provider Notes (Signed)
[facility]    CSN: [id number] Arrival date & time: 05/17/21  1744      History   Chief Complaint Chief Complaint  Patient presents with   Back Pain    APPT   Vaginal Itching    HPI Yolanda Payne is a 25 y.o. female.   25 yr old female pt, Yolanda Payne, presents to UC with chief complaint of low backpain after standing at work at [employer], pt states she does a lot of twisting. Pt denies fall or trauma. No loss of function, no saddle numbness, no loss of bowel and bladder. Pt also requesting refill on Valtrex for herpes flare, reports last one was last month.   The history is provided by the patient. No language interpreter was used.   History reviewed. No pertinent past medical history.  Patient Active Problem List   Diagnosis Date Noted   Acute bilateral low back pain without sciatica 05/17/2021   Muscle spasm of back 05/17/2021   Itching in the vaginal area 10/18/2020    History reviewed. No pertinent surgical history.  OB History     Gravida  0   Para  0   Term  0   Preterm  0   AB  0   Living  0      SAB  0   IAB  0   Ectopic  0   Multiple  0   Live Births  0            Home Medications    Prior to Admission medications   Medication Sig Start Date End Date Taking? Authorizing Provider  cyclobenzaprine (FLEXERIL) 5 MG tablet Take 1 tablet (5 mg total) by mouth 3 (three) times daily as needed for up to 5 days for muscle spasms. 05/17/21 05/22/21 Yes Reinette Cuneo, Para March, NP  valACYclovir (VALTREX) 1000 MG tablet Take 1 tablet (1,000 mg total) by mouth daily for 5 days. 05/17/21 05/22/21 Yes Quintavious Rinck, Para March, NP  metroNIDAZOLE (FLAGYL) 500 MG tablet Take 1 tablet (500 mg total) by mouth 2 (two) times daily. 04/12/21   Rhys Martini, PA-C  mupirocin ointment (BACTROBAN) 2 % Apply 1 application topically daily. 04/03/21   Raspet, Erin K, PA-C  ondansetron (ZOFRAN ODT) 8 MG disintegrating tablet Take 1 tablet (8 mg total) by mouth  every 8 (eight) hours as needed for nausea or vomiting. You can try taking this medication at the same time as the Flagyl to help prevent nausea and vomiting. 01/23/21   Rhys Martini, PA-C    Family History Family History  Problem Relation Age of Onset   Healthy Mother    Healthy Father    Hypertension Father     Social History Social History   Tobacco Use   Smoking status: Never   Smokeless tobacco: Never  Vaping Use   Vaping Use: Never used  Substance Use Topics   Alcohol use: Not Currently   Drug use: No     Allergies   Patient has no known allergies.   Review of Systems Review of Systems  Genitourinary:  Positive for genital sores. Negative for dysuria.  Musculoskeletal:  Positive for back pain and myalgias.  All other systems reviewed and are negative.   Physical Exam Triage Vital Signs ED Triage Vitals  Enc Vitals Group     BP 05/17/21 1806 119/82     Pulse Rate 05/17/21 1806 64     Resp 05/17/21 1806 16     Temp 05/17/21  1806 98.3 F (36.8 C)     Temp Source 05/17/21 1806 Oral     SpO2 05/17/21 1806 97 %     Weight --      Height --      Head Circumference --      Peak Flow --      Pain Score 05/17/21 1805 3     Pain Loc --      Pain Edu? --      Excl. in GC? --    No data found.  Updated Vital Signs BP 119/82 (BP Location: Right Arm)   Pulse 64   Temp 98.3 F (36.8 C) (Oral)   Resp 16   LMP 05/15/2021   SpO2 97%   Visual Acuity Right Eye Distance:   Left Eye Distance:   Bilateral Distance:    Right Eye Near:   Left Eye Near:    Bilateral Near:     Physical Exam Constitutional:      Appearance: Normal appearance. She is well-developed.  Genitourinary:    Comments: Exam deffered at pt request Musculoskeletal:     Lumbar back: Spasms and tenderness present. No swelling, edema, deformity, signs of trauma, lacerations or bony tenderness. Normal range of motion. Negative right straight leg raise test and negative left straight leg  raise test.       Back:     Comments: +TTP of paraspinal lumbar region  Neurological:     General: No focal deficit present.     Mental Status: She is alert and oriented to person, place, and time.     GCS: GCS eye subscore is 4. GCS verbal subscore is 5. GCS motor subscore is 6.  Psychiatric:        Attention and Perception: Attention normal.        Mood and Affect: Mood normal.        Speech: Speech normal.        Behavior: Behavior is cooperative.  , UTI   UC Treatments / Results  Labs (all labs ordered are listed, but only abnormal results are displayed) Labs Reviewed - No data to display  EKG   Radiology No results found.  Procedures Procedures (including critical care time)  Medications Ordered in UC Medications - No data to display  Initial Impression / Assessment and Plan / UC Course  I have reviewed the triage vital signs and the nursing notes.  Pertinent labs & imaging results that were available during my care of the patient were reviewed by me and considered in my medical decision making (see chart for details).     Ddx: herpes flare, back pain,back spasm, back strain Final Clinical Impressions(s) / UC Diagnoses   Final diagnoses:  Acute bilateral low back pain without sciatica  Muscle spasm of back  Genital herpes simplex, unspecified site     Discharge Instructions      Take meds as directed. Please follow up with PCP. May use biofreeze, salonpas, etc.      ED Prescriptions     Medication Sig Dispense Auth. Provider   valACYclovir (VALTREX) 1000 MG tablet Take 1 tablet (1,000 mg total) by mouth daily for 5 days. 5 tablet Darrelyn Morro, NP   cyclobenzaprine (FLEXERIL) 5 MG tablet Take 1 tablet (5 mg total) by mouth 3 (three) times daily as needed for up to 5 days for muscle spasms. 15 tablet Khaalid Lefkowitz, Para March, NP      PDMP not reviewed this encounter.   Clancy Gourd, NP  05/17/21 1900  

## 2021-05-17 NOTE — ED Triage Notes (Signed)
Pt presents with lower back pain and vaginal irritation xs 2 days.

## 2021-06-11 ENCOUNTER — Ambulatory Visit (HOSPITAL_COMMUNITY): Payer: Medicare Other

## 2021-07-12 ENCOUNTER — Other Ambulatory Visit: Payer: Self-pay

## 2021-07-12 ENCOUNTER — Ambulatory Visit
Admission: EM | Admit: 2021-07-12 | Discharge: 2021-07-12 | Disposition: A | Discharge status: Home / Self Care | Payer: Medicare Other | Attending: Physician Assistant | Admitting: Physician Assistant

## 2021-07-12 ENCOUNTER — Encounter: Payer: Self-pay | Admitting: Emergency Medicine

## 2021-07-12 DIAGNOSIS — B009 Herpesviral infection, unspecified: Secondary | ICD-10-CM | POA: Insufficient documentation

## 2021-07-12 DIAGNOSIS — Z113 Encounter for screening for infections with a predominantly sexual mode of transmission: Secondary | ICD-10-CM | POA: Diagnosis present

## 2021-07-12 MED ORDER — VALACYCLOVIR HCL 500 MG PO TABS: 500.0000 mg | ORAL_TABLET | Freq: Two times a day (BID) | ORAL | 0 refills | Status: AC

## 2021-07-12 NOTE — ED Provider Notes (Signed)
[facility]    CSN: [id number] Arrival date & time: 07/12/21  0854      History   Chief Complaint Chief Complaint  Patient presents with   Vaginal Discharge    HPI Yolanda Payne is a 25 y.o. female.   Patient here today for evaluation of vaginal discharge that she has noticed for the last week.  She reports there is a fishy odor and this has been similar to previous BV infections.  She would like STD screening as well.  She denies any other symptoms including nausea, vomiting, fever or chills.  She does report she has a single lesion to her genital area that is very small but somewhat painful.  She is concerned this may be the beginning of an herpes outbreak.  The history is provided by the patient.  Vaginal Discharge Associated symptoms: no fever, no nausea and no vomiting    History reviewed. No pertinent past medical history.  Patient Active Problem List   Diagnosis Date Noted   Acute bilateral low back pain without sciatica 05/17/2021   Muscle spasm of back 05/17/2021   Itching in the vaginal area 10/18/2020    History reviewed. No pertinent surgical history.  OB History     Gravida  0   Para  0   Term  0   Preterm  0   AB  0   Living  0      SAB  0   IAB  0   Ectopic  0   Multiple  0   Live Births  0            Home Medications    Prior to Admission medications   Medication Sig Start Date End Date Taking? Authorizing Provider  valACYclovir (VALTREX) 500 MG tablet Take 1 tablet (500 mg total) by mouth 2 (two) times daily for 7 days. 07/12/21 07/19/21 Yes Tomi Bamberger, PA-C  metroNIDAZOLE (FLAGYL) 500 MG tablet Take 1 tablet (500 mg total) by mouth 2 (two) times daily. 04/12/21   Rhys Martini, PA-C  mupirocin ointment (BACTROBAN) 2 % Apply 1 application topically daily. 04/03/21   Raspet, Erin K, PA-C  ondansetron (ZOFRAN ODT) 8 MG disintegrating tablet Take 1 tablet (8 mg total) by mouth every 8 (eight) hours as  needed for nausea or vomiting. You can try taking this medication at the same time as the Flagyl to help prevent nausea and vomiting. 01/23/21   Rhys Martini, PA-C    Family History Family History  Problem Relation Age of Onset   Healthy Mother    Healthy Father    Hypertension Father     Social History Social History   Tobacco Use   Smoking status: Never   Smokeless tobacco: Never  Vaping Use   Vaping Use: Never used  Substance Use Topics   Alcohol use: Not Currently   Drug use: No     Allergies   Patient has no known allergies.   Review of Systems Review of Systems  Constitutional:  Negative for chills and fever.  Eyes:  Negative for discharge and redness.  Respiratory:  Negative for shortness of breath.   Gastrointestinal:  Negative for nausea and vomiting.  Genitourinary:  Positive for genital sores and vaginal discharge.    Physical Exam Triage Vital Signs ED Triage Vitals  Enc Vitals Group     BP 07/12/21 1031 128/87     Pulse Rate 07/12/21 1031 (!) 55  Resp --      Temp 07/12/21 1031 97.8 F (36.6 C)     Temp Source 07/12/21 1031 Oral     SpO2 07/12/21 1031 97 %     Weight --      Height 07/12/21 1032 5\' 3"  (1.6 m)     Head Circumference --      Peak Flow --      Pain Score 07/12/21 1032 7     Pain Loc --      Pain Edu? --      Excl. in GC? --    No data found.  Updated Vital Signs BP 128/87 (BP Location: Left Arm)   Pulse (!) 55   Temp 97.8 F (36.6 C) (Oral)   Ht 5\' 3"  (1.6 m)   LMP 07/06/2021   SpO2 97%   BMI 38.09 kg/m      Physical Exam Vitals and nursing note reviewed.  Constitutional:      General: She is not in acute distress.    Appearance: Normal appearance. She is not ill-appearing.  HENT:     Head: Normocephalic and atraumatic.  Cardiovascular:     Rate and Rhythm: Normal rate.  Pulmonary:     Effort: Pulmonary effort is normal.  Neurological:     Mental Status: She is alert.  Psychiatric:        Mood and  Affect: Mood normal.        Behavior: Behavior normal.     UC Treatments / Results  Labs (all labs ordered are listed, but only abnormal results are displayed) Labs Reviewed  HIV ANTIBODY (ROUTINE TESTING W REFLEX)  HEPATITIS PANEL, ACUTE  RPR  CERVICOVAGINAL ANCILLARY ONLY    EKG   Radiology No results found.  Procedures Procedures (including critical care time)  Medications Ordered in UC Medications - No data to display  Initial Impression / Assessment and Plan / UC Course  I have reviewed the triage vital signs and the nursing notes.  Pertinent labs & imaging results that were available during my care of the patient were reviewed by me and considered in my medical decision making (see chart for details).    Valtrex prescribed to cover possible herpes outbreak, and STD screening as ordered.  Recommended warm compresses in the event that small genital lesion is actually an abscess.  Encouraged follow-up if symptoms fail to improve or worsen anyway.  Final Clinical Impressions(s) / UC Diagnoses   Final diagnoses:  Screening for STD (sexually transmitted disease)  Herpes simplex   Discharge Instructions   None    ED Prescriptions     Medication Sig Dispense Auth. Provider   valACYclovir (VALTREX) 500 MG tablet Take 1 tablet (500 mg total) by mouth 2 (two) times daily for 7 days. 14 tablet , PA-C      PDMP not reviewed this encounter.   07/08/2021, PA-C 07/12/21 1159

## 2021-07-12 NOTE — ED Triage Notes (Signed)
Patient c/o possible BV or yeast infection x 1 week.  Vaginal discharge and fishy odor.  Patient would like STI testing.

## 2021-07-13 LAB — CERVICOVAGINAL ANCILLARY ONLY
Bacterial Vaginitis (gardnerella): NEGATIVE
Candida Glabrata: NEGATIVE
Candida Vaginitis: NEGATIVE
Chlamydia: NEGATIVE
Comment: NEGATIVE
Comment: NEGATIVE
Comment: NEGATIVE
Comment: NEGATIVE
Comment: NEGATIVE
Comment: NORMAL
Neisseria Gonorrhea: NEGATIVE
Trichomonas: NEGATIVE

## 2021-07-13 LAB — HIV ANTIBODY (ROUTINE TESTING W REFLEX): HIV Screen 4th Generation wRfx: NONREACTIVE

## 2021-07-13 LAB — RPR: RPR Ser Ql: NONREACTIVE

## 2021-08-24 ENCOUNTER — Ambulatory Visit
Admission: RE | Admit: 2021-08-24 | Discharge status: Against Medical Advice | Payer: Medicare Other | Source: Ambulatory Visit

## 2021-08-25 ENCOUNTER — Ambulatory Visit
Admission: RE | Admit: 2021-08-25 | Discharge: 2021-08-25 | Disposition: A | Discharge status: Home / Self Care | Payer: Medicare Other | Source: Ambulatory Visit | Attending: Internal Medicine | Admitting: Internal Medicine

## 2021-08-25 ENCOUNTER — Other Ambulatory Visit: Payer: Self-pay

## 2021-08-25 VITALS — BP 134/87 | HR 92 | Temp 98.9°F | Resp 18

## 2021-08-25 DIAGNOSIS — A6000 Herpesviral infection of urogenital system, unspecified: Secondary | ICD-10-CM | POA: Insufficient documentation

## 2021-08-25 DIAGNOSIS — N898 Other specified noninflammatory disorders of vagina: Secondary | ICD-10-CM | POA: Diagnosis present

## 2021-08-25 DIAGNOSIS — Z113 Encounter for screening for infections with a predominantly sexual mode of transmission: Secondary | ICD-10-CM | POA: Insufficient documentation

## 2021-08-25 MED ORDER — VALACYCLOVIR HCL 1 G PO TABS: 1000.0000 mg | ORAL_TABLET | Freq: Every day | ORAL | 0 refills | Status: AC

## 2021-08-25 NOTE — Discharge Instructions (Signed)
You are being treated for possible genital herpes with valacyclovir antiviral medication.  Your vaginal swab is pending.  We will call if it is positive.  Please refrain from sexual activity until treatment test results are complete.

## 2021-08-25 NOTE — ED Provider Notes (Signed)
[facility]    CSN: [id number] Arrival date & time: 08/25/21  1248      History   Chief Complaint Chief Complaint  Patient presents with   1p appointment   herpes outbreak    HPI Yolanda Payne is a 25 y.o. female.   Patient presents with 2-day history of possible genital herpes outbreak.  Patient reports that she has a few different lesions present to the right labia.  Denies pain to the area.  Denies vaginal discharge, hematuria, urinary burning, urinary frequency, fever, back pain, pelvic pain, abdominal pain.  Patient reports that she does have a history of genital herpes and has outbreaks approximately monthly.  Was recently seen in November and received antiviral medications with improvement of symptoms.    History reviewed. No pertinent past medical history.  Patient Active Problem List   Diagnosis Date Noted   Acute bilateral low back pain without sciatica 05/17/2021   Muscle spasm of back 05/17/2021   Itching in the vaginal area 10/18/2020    History reviewed. No pertinent surgical history.  OB History     Gravida  0   Para  0   Term  0   Preterm  0   AB  0   Living  0      SAB  0   IAB  0   Ectopic  0   Multiple  0   Live Births  0            Home Medications    Prior to Admission medications   Medication Sig Start Date End Date Taking? Authorizing Provider  valACYclovir (VALTREX) 1000 MG tablet Take 1 tablet (1,000 mg total) by mouth daily for 5 days. 08/25/21 08/30/21 Yes Seif Teichert, Michele Rockers, FNP  metroNIDAZOLE (FLAGYL) 500 MG tablet Take 1 tablet (500 mg total) by mouth 2 (two) times daily. 04/12/21   Hazel Sams, PA-C  mupirocin ointment (BACTROBAN) 2 % Apply 1 application topically daily. 04/03/21   Raspet, Erin K, PA-C  ondansetron (ZOFRAN ODT) 8 MG disintegrating tablet Take 1 tablet (8 mg total) by mouth every 8 (eight) hours as needed for nausea or vomiting. You can try taking this medication at the same time  as the Flagyl to help prevent nausea and vomiting. 01/23/21   Hazel Sams, PA-C    Family History Family History  Problem Relation Age of Onset   Healthy Mother    Healthy Father    Hypertension Father     Social History Social History   Tobacco Use   Smoking status: Never   Smokeless tobacco: Never  Vaping Use   Vaping Use: Never used  Substance Use Topics   Alcohol use: Not Currently   Drug use: No     Allergies   Patient has no known allergies.   Review of Systems Review of Systems Per HPI  Physical Exam Triage Vital Signs ED Triage Vitals  Enc Vitals Group     BP 08/25/21 1313 134/87     Pulse Rate 08/25/21 1313 92     Resp 08/25/21 1313 18     Temp 08/25/21 1313 98.9 F (37.2 C)     Temp Source 08/25/21 1313 Oral     SpO2 08/25/21 1313 97 %     Weight --      Height --      Head Circumference --      Peak Flow --      Pain Score 08/25/21 1316  7     Pain Loc --      Pain Edu? --      Excl. in [place]? --    No data found.  Updated Vital Signs BP 134/87 (BP Location: Left Arm)    Pulse 92    Temp 98.9 F (37.2 C) (Oral)    Resp 18    LMP 08/08/2021 (Exact Date)    SpO2 97%   Visual Acuity Right Eye Distance:   Left Eye Distance:   Bilateral Distance:    Right Eye Near:   Left Eye Near:    Bilateral Near:     Physical Exam Exam conducted with a chaperone present.  Constitutional:      General: She is not in acute distress.    Appearance: Normal appearance. She is not toxic-appearing or diaphoretic.  HENT:     Head: Normocephalic and atraumatic.  Eyes:     Extraocular Movements: Extraocular movements intact.     Conjunctiva/sclera: Conjunctivae normal.  Pulmonary:     Effort: Pulmonary effort is normal.  Genitourinary:    Labia:        Left: Lesion present.      Comments: Patient has a grouped area of vesicular lesions present to right inner labia.  Also has some clear to white vaginal discharge present. Neurological:     General: No  focal deficit present.     Mental Status: She is alert and oriented to person, place, and time. Mental status is at baseline.  Psychiatric:        Mood and Affect: Mood normal.        Behavior: Behavior normal.        Thought Content: Thought content normal.        Judgment: Judgment normal.     UC Treatments / Results  Labs (all labs ordered are listed, but only abnormal results are displayed) Labs Reviewed  CERVICOVAGINAL ANCILLARY ONLY    EKG   Radiology No results found.  Procedures Procedures (including critical care time)  Medications Ordered in UC Medications - No data to display  Initial Impression / Assessment and Plan / UC Course  I have reviewed the triage vital signs and the nursing notes.  Pertinent labs & imaging results that were available during my care of the patient were reviewed by me and considered in my medical decision making (see chart for details).     Lesions are consistent with genital herpes.  It appears the patient had positive HSV test in March 2022.  Offered patient repeat testing but patient declined.  Do think this is reasonable as patient has had a positive test in the past.  Will treat with antiviral medication for recurrent genital herpes.  Vaginal swab pending as patient reports that she noticed vaginal discharge while undressing for physical exam.  Will await to treat for results.  Patient to refrain from sexual activity until test results and treatment are complete.  Advised patient that it would be best to be followed by PCP or gynecologist for recurrent HSV outbreaks.  Discussed strict return precautions.  Patient verbalized understanding and was agreeable with plan. Final Clinical Impressions(s) / UC Diagnoses   Final diagnoses:  Genital herpes simplex, unspecified site  Vaginal discharge  Screening examination for venereal disease     Discharge Instructions      You are being treated for possible genital herpes with  valacyclovir antiviral medication.  Your vaginal swab is pending.  We will call if it is  positive.  Please refrain from sexual activity until treatment test results are complete.    ED Prescriptions     Medication Sig Dispense Auth. Provider   valACYclovir (VALTREX) 1000 MG tablet Take 1 tablet (1,000 mg total) by mouth daily for 5 days. 5 tablet Nelson, Acie Fredrickson, [place]      PDMP not reviewed this encounter.   Gustavus Bryant, [place] 08/25/21 1356

## 2021-08-25 NOTE — ED Triage Notes (Signed)
Two day h/o genital herpes outbreak. Pt does not have any antivirals meds. Pt does not have a gyn provider.

## 2021-08-28 LAB — CERVICOVAGINAL ANCILLARY ONLY
Bacterial Vaginitis (gardnerella): NEGATIVE
Candida Glabrata: NEGATIVE
Candida Vaginitis: NEGATIVE
Chlamydia: NEGATIVE
Comment: NEGATIVE
Comment: NEGATIVE
Comment: NEGATIVE
Comment: NEGATIVE
Comment: NEGATIVE
Comment: NORMAL
Neisseria Gonorrhea: NEGATIVE
Trichomonas: NEGATIVE

## 2021-09-19 ENCOUNTER — Ambulatory Visit: Payer: Medicare Other

## 2021-09-20 ENCOUNTER — Ambulatory Visit
Admission: RE | Admit: 2021-09-20 | Discharge: 2021-09-20 | Disposition: A | Discharge status: Home / Self Care | Payer: Medicare Other | Source: Ambulatory Visit | Attending: Physician Assistant | Admitting: Physician Assistant

## 2021-09-20 ENCOUNTER — Other Ambulatory Visit: Payer: Self-pay

## 2021-09-20 VITALS — BP 125/79 | HR 65 | Temp 98.2°F | Resp 18

## 2021-09-20 DIAGNOSIS — B009 Herpesviral infection, unspecified: Secondary | ICD-10-CM | POA: Diagnosis not present

## 2021-09-20 MED ORDER — VALACYCLOVIR HCL 1 G PO TABS: 1000.0000 mg | ORAL_TABLET | Freq: Two times a day (BID) | ORAL | 0 refills | Status: AC

## 2021-09-20 NOTE — ED Triage Notes (Signed)
Pt presents today for a med refill for herpes. Pt reports an onset yesterday of prodrome HA, muscle aches and itchiness and would like a refill for her meds. Pt has found a gyn and has an appt set for 2/23.

## 2021-09-20 NOTE — ED Provider Notes (Signed)
[facility]    CSN: [id number] Arrival date & time: 09/20/21  7001      History   Chief Complaint Chief Complaint  Patient presents with   Medication Refill   10a appt    HPI Yolanda Payne is a 26 y.o. female.   Patient here today for refill of herpes medication. She reports she is currently experiencing suspected prodrome symptoms which are typical for her. She has had itching and body aches as well as headache. She has not had fever.   The history is provided by the patient.   History reviewed. No pertinent past medical history.  Patient Active Problem List   Diagnosis Date Noted   Acute bilateral low back pain without sciatica 05/17/2021   Muscle spasm of back 05/17/2021   Itching in the vaginal area 10/18/2020    History reviewed. No pertinent surgical history.  OB History     Gravida  0   Para  0   Term  0   Preterm  0   AB  0   Living  0      SAB  0   IAB  0   Ectopic  0   Multiple  0   Live Births  0            Home Medications    Prior to Admission medications   Medication Sig Start Date End Date Taking? Authorizing Provider  valACYclovir (VALTREX) 1000 MG tablet Take 1 tablet (1,000 mg total) by mouth 2 (two) times daily for 14 days. 09/20/21 10/04/21 Yes Tomi Bamberger, PA-C  metroNIDAZOLE (FLAGYL) 500 MG tablet Take 1 tablet (500 mg total) by mouth 2 (two) times daily. 04/12/21   Rhys Martini, PA-C  mupirocin ointment (BACTROBAN) 2 % Apply 1 application topically daily. 04/03/21   Raspet, Erin K, PA-C  ondansetron (ZOFRAN ODT) 8 MG disintegrating tablet Take 1 tablet (8 mg total) by mouth every 8 (eight) hours as needed for nausea or vomiting. You can try taking this medication at the same time as the Flagyl to help prevent nausea and vomiting. 01/23/21   Rhys Martini, PA-C    Family History Family History  Problem Relation Age of Onset   Healthy Mother    Healthy Father    Hypertension Father      Social History Social History   Tobacco Use   Smoking status: Never   Smokeless tobacco: Never  Vaping Use   Vaping Use: Never used  Substance Use Topics   Alcohol use: Not Currently   Drug use: No     Allergies   Patient has no known allergies.   Review of Systems Review of Systems  Constitutional:  Negative for chills and fever.  Eyes:  Negative for discharge and redness.  Respiratory:  Negative for shortness of breath.   Gastrointestinal:  Negative for abdominal pain, nausea and vomiting.  Musculoskeletal:  Positive for myalgias.  Neurological:  Positive for headaches.    Physical Exam Triage Vital Signs ED Triage Vitals  Enc Vitals Group     BP      Pulse      Resp      Temp      Temp src      SpO2      Weight      Height      Head Circumference      Peak Flow      Pain Score  Pain Loc      Pain Edu?      Excl. in [place]?    No data found.  Updated Vital Signs BP 125/79 (BP Location: Right Arm)    Pulse 65    Temp 98.2 F (36.8 C) (Oral)    Resp 18    SpO2 97%      Physical Exam Vitals and nursing note reviewed.  Constitutional:      General: She is not in acute distress.    Appearance: Normal appearance. She is not ill-appearing.  HENT:     Head: Normocephalic and atraumatic.  Eyes:     Conjunctiva/sclera: Conjunctivae normal.  Cardiovascular:     Rate and Rhythm: Normal rate.  Pulmonary:     Effort: Pulmonary effort is normal.  Neurological:     Mental Status: She is alert.  Psychiatric:        Mood and Affect: Mood normal.        Behavior: Behavior normal.        Thought Content: Thought content normal.     UC Treatments / Results  Labs (all labs ordered are listed, but only abnormal results are displayed) Labs Reviewed - No data to display  EKG   Radiology No results found.  Procedures Procedures (including critical care time)  Medications Ordered in UC Medications - No data to display  Initial Impression /  Assessment and Plan / UC Course  I have reviewed the triage vital signs and the nursing notes.  Pertinent labs & imaging results that were available during my care of the patient were reviewed by me and considered in my medical decision making (see chart for details).    Valtrex refilled- advised to take for one week, if symptoms clear may discontinue. Recommended follow up with GYN. Encouraged follow up in our office sooner if needed.  Final Clinical Impressions(s) / UC Diagnoses   Final diagnoses:  Herpes simplex   Discharge Instructions   None    ED Prescriptions     Medication Sig Dispense Auth. Provider   valACYclovir (VALTREX) 1000 MG tablet Take 1 tablet (1,000 mg total) by mouth 2 (two) times daily for 14 days. 28 tablet Tomi Bamberger, PA-C      PDMP not reviewed this encounter.   Tomi Bamberger, PA-C 09/20/21 1016

## 2021-10-12 ENCOUNTER — Other Ambulatory Visit: Payer: Self-pay

## 2021-10-12 ENCOUNTER — Ambulatory Visit (INDEPENDENT_AMBULATORY_CARE_PROVIDER_SITE_OTHER): Payer: Medicare Other

## 2021-10-12 VITALS — BP 123/71 | HR 57 | Wt 204.8 lb

## 2021-10-12 DIAGNOSIS — N923 Ovulation bleeding: Secondary | ICD-10-CM | POA: Diagnosis not present

## 2021-10-12 DIAGNOSIS — A6000 Herpesviral infection of urogenital system, unspecified: Secondary | ICD-10-CM

## 2021-10-12 MED ORDER — VALACYCLOVIR HCL 500 MG PO TABS: 500.0000 mg | ORAL_TABLET | Freq: Every day | ORAL | 3 refills | Status: DC

## 2021-10-12 NOTE — Progress Notes (Signed)
GYNECOLOGY PROBLEM OFFICE VISIT NOTE  History:  Yolanda Payne is a 26 y.o. G0P0000 here today for spotting. She states she had bright red spotting, while wiping, yesterday.  She denies recent sexual activity and reports she is not currently sexually active.  She states her LMP was Jan 13th and was normal lasting 6 days. She denies any abnormal vaginal discharge, irritation, or odor.  She reports irritation with "flares up" and reports last HSV outbreak was Jan 9th. She reports she takes Valtrex for outbreaks and reports she has them every month since diagnosis in April.    History reviewed. No pertinent past medical history.  History reviewed. No pertinent surgical history.  The following portions of the patient's history were reviewed and updated as appropriate: allergies, current medications, past family history, past medical history, past social history, past surgical history and problem list.   Health Maintenance:  Normal pap and negative HRHPV on March 2022.  No mammogram on file d/t age.   Review of Systems:  Genito-Urinary ROS: negative Gastrointestinal ROS: negative Objective:  Vitals: BP 123/71    Pulse (!) 57    Wt 204 lb 12.8 oz (92.9 kg)    LMP 09/22/2021    BMI 36.28 kg/m   Physical Exam: Physical Exam Constitutional:      Appearance: Normal appearance.  Genitourinary:     Genitourinary Comments: Small amt brownish yellow mucoid discharge with some bright red blood noted from cervix.  C/w early menses.      Right Labia: No rash, tenderness or lesions.    Left Labia: No tenderness, lesions or rash.    No vaginal discharge or bleeding.     Cervical discharge present.     No cervical friability, lesion, polyp or nabothian cyst.  HENT:     Head: Normocephalic and atraumatic.  Eyes:     Conjunctiva/sclera: Conjunctivae normal.  Cardiovascular:     Rate and Rhythm: Normal rate and regular rhythm.  Pulmonary:     Effort: Pulmonary effort is normal. No  respiratory distress.     Breath sounds: Normal breath sounds.  Abdominal:     Tenderness: There is no abdominal tenderness.  Musculoskeletal:        General: Normal range of motion.     Cervical back: Normal range of motion.  Neurological:     Mental Status: She is alert and oriented to person, place, and time.  Skin:    General: Skin is warm and dry.     Findings: Abrasion (Right knee s/t nair usage) present.  Psychiatric:        Mood and Affect: Mood normal.        Behavior: Behavior normal.        Thought Content: Thought content normal.  Vitals reviewed. Exam conducted with a chaperone present.     Labs and Imaging: No results found.  Assessment & Plan:  26 year old HSV Infection-Recurrent Spotting between Menses  -Exam performed and findings discussed. -Informed that spotting likely from menstrual cycle that is to start within the next few days. -Encouraged to track cycle for improved timing. -Extensive discussion regarding HSV diagnosis, outbreaks, management, and sexual activity. -Educated on viral shedding, condom usage, and suppression therapy. -Will start on Valtrex 500mg  daily and can increase to BID x 5-7 days for outbreaks. -Patient questions addressed.  -Reviewed ASCCP guidelines for pap smears. Plan for next one in 2025. -Encouraged to return if other questions or concerns arise.   Total face-to-face time with  patient: 15 minutes   Gavin Pound, [place] 10/12/2021 2:15 PM

## 2021-10-12 NOTE — Patient Instructions (Signed)
AREA FAMILY PRACTICE PHYSICIANS  [place] ([address]) [facility] [address] [place], [place] [address] [phone] Mon-Fri 8:30-12:30, 1:30-5:00 Accepting Medicaid [facility] [address], [place], [place] [address] [phone] Mon-Fri 8:00-5:30 [facility] [address] [place], [place] [address] [phone] Mon, Tue, Thur, Fri 8:30-5:00, Wed 10:00-7:00 (closed 1-2pm) Accepting Medicaid [facility] [address], [place], [place]  [address] Phone - [phone]   Fax - [phone]  [place] ([address]) [facility] [address] [place], [place] [address] [phone] Mon-Fri 8:00-5:00 [facility] ([facility])  [address] [place], [place] [address] [phone] Mon-Fri 8:30-5:30, Sat (Oct.-Mar.) 9:00-1:00 Accepting Medicaid  [place] ([address]) [facility] [address], [place], [place] [address] [phone] Mon-Fri 8:00-5:00  [place] ([address]) [facility] [address], [place], [place] [address] [phone] Mon-Fri 8:00-5:00 [facility] [address], [place], [place] [address] [phone] Mon-Fri 8:00-5:00 [facility] [address] [place], [place] [address] [phone] Mon-Fri 8:00-5:00 [facility] [address] [place] [address] [phone] Mon-Fri 7:30-5:30  [place] ([address] & [address]) [facility] [address] [place], [place] [address] [phone] Mon-Thur 8:00-6:00 Accepting Medicaid [facility] [address] [place], [place] [address] [phone] Mon-Thur 7:30-7:30, Fri 7:30-4:30 Accepting  Medicaid [facility] [address], [place], [place]  [address] [phone]   Fax - [phone]  [place] ([address] & [address]) [facility] [address] , [place] [address] [phone] Mon-Fri 7:00-5:00 [facility] [address], [place], [place] [address] [phone] Mon-Fri 8:00-5:00 Accepting Medicaid [facility] [address], , [place] [address] [phone] Mon-Fri 8:00-5:00 Accepting Medicaid  [place] ([address]) [facility] [place] [address] [place], [place] [address] [phone] Mon-Fri 8:00-5:00 [facility] [address], [place], [place] [address] [phone] Mon-Fri 8:00-5:00 Accepting Medicaid [facility] ([facility] [address], [place], [place] [address] [phone] Mon-Fri 8:00-5:30, Sat&Sun by appointment (phones open at 8:30) Accepting Medicaid  [place] ([address]) [facility] [address] [place] [address] [place], [place] [address] [phone] Mon-Thur 8:00-7:00, Fri 8:00-5:00, Sat 8:00-12:00, Sun 9:00-12:00 Accepting Medicaid [facility] [address], [place], [place] [address] [phone] Mon-Thur 8:00-5:00 Accepting Medicaid [facility] [address] [place], [place] [address] [phone] Mon-Fri 8:00-5:30, Sat (Oct.-Mar.) 9:00-1:00 Accepting Medicaid  [place] ([address]) [facility] [address], [place], [place] [address] [phone] Mon-Fri 8:00-5:00 Accepting Medicaid   [place] ([address]) [facility] [place] [address], [place], [place] [address] [phone] Mon-Fri 8:00-5:00 [facility] [place] [address], [place], [place] [address] [phone] Mon-Fri 8:00-5:00 [facility] [place] [address] [place] [address], [place], [place] [address] [phone] Mon-Fri 8:00-5:00 After hours clinic ([address] [place], [place] [address]) [phone] Mon-Fri 5:00-8:00, Sat 12:00-6:00, Sun 10:00-4:00 Accepting Medicaid [facility] [place]   [address], [place], [place]  [address] [phone]   Fax - [phone]  [place] ([address]) [facility] [place] [facility] [address], [place], [place] [address] [phone] Mon-Fri 8:00-5:00 [facility] [place] ([facility] [place]) [address], [place], [place] [address] [phone] Mon-Thur 8:00-7:00, Fri 8:00-5:00, Sat 8:00-12:00    

## 2021-10-12 NOTE — Progress Notes (Signed)
Pt presents for one episode bright red spotting after wiping yesterday. Pt needs rf on Valacyclovir. Diagnosed with HSV 10 December 2020. She is not sexually active; declines BC at this time.  Normal pap 11/15/20

## 2021-10-19 ENCOUNTER — Encounter: Payer: Medicare Other | Admitting: Obstetrics & Gynecology

## 2021-12-26 ENCOUNTER — Other Ambulatory Visit (HOSPITAL_COMMUNITY)
Admission: RE | Admit: 2021-12-26 | Discharge: 2021-12-26 | Disposition: A | Discharge status: Home / Self Care | Payer: Medicare Other | Source: Ambulatory Visit

## 2021-12-26 ENCOUNTER — Ambulatory Visit (INDEPENDENT_AMBULATORY_CARE_PROVIDER_SITE_OTHER): Payer: Medicare Other

## 2021-12-26 VITALS — BP 117/81 | HR 66 | Ht 63.0 in | Wt 206.0 lb

## 2021-12-26 DIAGNOSIS — A6000 Herpesviral infection of urogenital system, unspecified: Secondary | ICD-10-CM

## 2021-12-26 DIAGNOSIS — N898 Other specified noninflammatory disorders of vagina: Secondary | ICD-10-CM | POA: Insufficient documentation

## 2021-12-26 DIAGNOSIS — Z0000 Encounter for general adult medical examination without abnormal findings: Secondary | ICD-10-CM | POA: Insufficient documentation

## 2021-12-26 DIAGNOSIS — F064 Anxiety disorder due to known physiological condition: Secondary | ICD-10-CM

## 2021-12-26 MED ORDER — VALACYCLOVIR HCL 1 G PO TABS: 1000.0000 mg | ORAL_TABLET | Freq: Two times a day (BID) | ORAL | 6 refills | Status: DC

## 2021-12-26 NOTE — Progress Notes (Signed)
Patient presents for AEX. Patient has no concerns today. Patient would like to be checked for bv and yeast because she gets them frequently. Declines STD testing. ? ?Last pap: 11/2020 Normal ?

## 2021-12-26 NOTE — Progress Notes (Signed)
? ? ?GYNECOLOGY OFFICE VISIT NOTE-WELL WOMAN EXAM ? ?History:  ? Yolanda Payne [id number] here today for annual exam. She continues to express concern with HSV diagnosis.  She states she is not currently sexually active and feels that she may never be d/t HSV!  She states she has been experiencing an outbreak since March despite taking medication.  She admits to missing one dose, but has since been regimented.  She reports resolution of resent spotting b/t periods and denies pelvic pain.  She reports recurrent BV and yeast and request testing today. ? ?Birth Control:  None ? ?Reproductive Concerns ?Sexually Active: Not Currently ?Partners Type: Female ?Number of partners in last year: ?STD Testing: Desires  ? ?Vaginal/GU Concerns:Current outbreak.  Reports periods are regular, lasting 6 days with heavy to moderate flow.  She reports cramping, PMS, and breast tenderness.  She states she uses heating pad and advil for cramping relief.  She denies issues with urination, constipation, or diarrhea. ? ?Breast Concerns/Exams: Performs monthly. No issues or concerns. Denies family history of breast, uterine, cervical, or ovarian cancer ? ?Medical and Nutrition ?PCP: None ?Significant PMx: None ?Exercise:Occasionally. Reports work requires lots of walking and lifting.  ?Tobacco/Drugs/Alcohol: Not Assessed ?Nutrition: Reports balanced intake.  States she has "cut back on snacks." ? ?Social ?Safety at home: Endorses ?DV/A: Denies ?Social Support: Endorses ?Employment: Air cabin crew ? ?History reviewed. No pertinent past medical history. ? ?History reviewed. No pertinent surgical history. ? ?The following portions of the patient's history were reviewed and updated as appropriate: allergies, current medications, past family history, past medical history, past social history, past surgical history and problem list.  ? ?Health Maintenance:  Normal pap and negative HRHPV on March 2023.  No mammogram history d/t age.  ? ?Review  of Systems:  ?Pertinent items noted in HPI and remainder of comprehensive ROS otherwise negative.   ? ?Objective:  ?  ?Physical Exam ?BP 117/81   Pulse 66   Ht 5\' 3"  (1.6 m)   Wt 206 lb (93.4 kg)   LMP 12/13/2021   BMI 36.49 kg/m?  ?Physical Exam ?Vitals reviewed.  ?Constitutional:   ?   Appearance: Normal appearance.  ?HENT:  ?   Head: Normocephalic and atraumatic.  ?Eyes:  ?   Conjunctiva/sclera: Conjunctivae normal.  ?Cardiovascular:  ?   Rate and Rhythm: Normal rate and regular rhythm.  ?   Heart sounds: Normal heart sounds.  ?Pulmonary:  ?   Effort: Pulmonary effort is normal. No respiratory distress.  ?   Breath sounds: Normal breath sounds.  ?Chest:  ?   Comments: CBE Deferred ?Abdominal:  ?   General: Bowel sounds are normal.  ?   Palpations: Abdomen is soft.  ?   Tenderness: There is no abdominal tenderness.  ?Musculoskeletal:     ?   General: Normal range of motion.  ?   Cervical back: Normal range of motion.  ?Skin: ?   General: Skin is warm and dry.  ?Neurological:  ?   Mental Status: She is alert and oriented to person, place, and time.  ?Psychiatric:     ?   Mood and Affect: Mood normal.     ?   Behavior: Behavior normal.     ?   Thought Content: Thought content normal.  ?  ? ?Labs and Imaging ?No results found for this or any previous visit (from the past 168 hour(s)). ?No results found. ?  ?Assessment & Plan:  ?26 year old female ?Well Woman Exam ?  Pap Smear UTD ?H/O HSV ?Vaginal Discharge ? ?1. Well woman exam without gynecological exam ?-Exam performed and findings discussed. ?-Educated on ASCCP guidelines regarding pap smear evaluation and frequency. ?-Informed that pap not due until 2025 based on history and most recent results. ?-Educated on AHA exercise recommendations of 30 minutes of moderate to vigorous activity at least 5x/week. ?-Educated and encouraged to continue monthly SBE with increased breast awareness including examination of breast for skin changes, moles, tenderness, etc.   ? ?- Cervicovaginal ancillary only( [facility]) ? ?2. Recurrent genital HSV (herpes simplex virus) infection ?-Discussed increasing medication for usage during times of outbreaks. ?-Educated on how stress can lead to outbreaks that lasts longer. ?-Will send in new script for Valtrex 1000mg  BID x 7 days, Disp 14, RF 6 for usage during outbreaks. ?-Instructed to continue 500mg  BID for daily suppression.  ?- Cervicovaginal ancillary only( [facility]) ? ?3. Vaginal discharge ?-Patient to collect CV via self swab ?-Results to be released via mychart. Treatment as appropriate ?- Cervicovaginal ancillary only( [facility]) ? 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 ?-Reassurance and affirmations given by provider. ?-Discussed referral for behavioral health to further discuss and process. ?-Patient agreeable. ?-Patient case discussed with A. , LCSW who feels referral would be appropriate and welcomed! ?-Referral placed with [facility] ? ? ?Routine preventative health maintenance measures emphasized. ?Please refer to After Visit Summary for other counseling recommendations.  ? ?No follow-ups on file. ?  ?  ? ? , CNM ?12/26/2021  ? ? ? ? ?

## 2021-12-27 LAB — CERVICOVAGINAL ANCILLARY ONLY
Bacterial Vaginitis (gardnerella): NEGATIVE
Candida Glabrata: NEGATIVE
Candida Vaginitis: NEGATIVE
Comment: NEGATIVE
Comment: NEGATIVE
Comment: NEGATIVE

## 2022-01-09 ENCOUNTER — Institutional Professional Consult (permissible substitution): Payer: Medicare Other | Admitting: Licensed Clinical Social Worker

## 2022-01-09 ENCOUNTER — Other Ambulatory Visit: Payer: Self-pay

## 2022-01-09 ENCOUNTER — Emergency Department (HOSPITAL_COMMUNITY)
Admission: EM | Admit: 2022-01-09 | Discharge: 2022-01-10 | Disposition: A | Discharge status: Home / Self Care | Payer: Medicare Other | Attending: Emergency Medicine | Admitting: Emergency Medicine

## 2022-01-09 ENCOUNTER — Encounter (HOSPITAL_COMMUNITY): Payer: Self-pay

## 2022-01-09 DIAGNOSIS — R1013 Epigastric pain: Secondary | ICD-10-CM | POA: Diagnosis present

## 2022-01-09 DIAGNOSIS — D72829 Elevated white blood cell count, unspecified: Secondary | ICD-10-CM | POA: Insufficient documentation

## 2022-01-09 DIAGNOSIS — A084 Viral intestinal infection, unspecified: Secondary | ICD-10-CM | POA: Insufficient documentation

## 2022-01-09 DIAGNOSIS — N9489 Other specified conditions associated with female genital organs and menstrual cycle: Secondary | ICD-10-CM | POA: Diagnosis not present

## 2022-01-09 LAB — COMPREHENSIVE METABOLIC PANEL
ALT: 15 U/L (ref 0–44)
AST: 18 U/L (ref 15–41)
Albumin: 4 g/dL (ref 3.5–5.0)
Alkaline Phosphatase: 55 U/L (ref 38–126)
Anion gap: 5 (ref 5–15)
BUN: 13 mg/dL (ref 6–20)
CO2: 24 mmol/L (ref 22–32)
Calcium: 8.9 mg/dL (ref 8.9–10.3)
Chloride: 110 mmol/L (ref 98–111)
Creatinine, Ser: 0.79 mg/dL (ref 0.44–1.00)
GFR, Estimated: >60 mL/min (ref >60)
Glucose, Bld: 126 mg/dL — ABNORMAL HIGH (ref 70–99)
Potassium: 4.4 mmol/L (ref 3.5–5.1)
Sodium: 139 mmol/L (ref 135–145)
Total Bilirubin: 0.9 mg/dL (ref 0.3–1.2)
Total Protein: 7.5 g/dL (ref 6.5–8.1)

## 2022-01-09 LAB — CBC
HCT: 46.4 % — ABNORMAL HIGH (ref 36.0–46.0)
Hemoglobin: 15.4 g/dL — ABNORMAL HIGH (ref 12.0–15.0)
MCH: 29.5 pg (ref 26.0–34.0)
MCHC: 33.2 g/dL (ref 30.0–36.0)
MCV: 88.9 fL (ref 80.0–100.0)
Platelets: 211 10*3/uL (ref 150–400)
RBC: 5.22 MIL/uL — ABNORMAL HIGH (ref 3.87–5.11)
RDW: 13 % (ref 11.5–15.5)
WBC: 12 10*3/uL — ABNORMAL HIGH (ref 4.0–10.5)
nRBC: 0 % (ref 0.0–0.2)

## 2022-01-09 LAB — URINALYSIS, ROUTINE W REFLEX MICROSCOPIC
Bilirubin Urine: NEGATIVE
Glucose, UA: NEGATIVE mg/dL
Ketones, ur: 20 mg/dL — AB
Leukocytes,Ua: NEGATIVE
Nitrite: NEGATIVE
Protein, ur: NEGATIVE mg/dL
Specific Gravity, Urine: 1.02 (ref 1.005–1.030)
pH: 5 (ref 5.0–8.0)

## 2022-01-09 LAB — LIPASE, BLOOD: Lipase: 27 U/L (ref 11–51)

## 2022-01-09 LAB — I-STAT BETA HCG BLOOD, ED (MC, WL, AP ONLY): I-stat hCG, quantitative: <5 m[IU]/mL (ref <5)

## 2022-01-09 MED ORDER — SUCRALFATE 1 G PO TABS
1.0000 g | ORAL_TABLET | Freq: Once | ORAL | Status: AC
Start: 2022-01-09 — End: 2022-01-09
  Administered 2022-01-09: 1 g via ORAL
  Filled 2022-01-09: qty 1

## 2022-01-09 MED ORDER — DICYCLOMINE HCL 10 MG PO CAPS
10.0000 mg | ORAL_CAPSULE | Freq: Once | ORAL | Status: AC
  Administered 2022-01-09: 10 mg via ORAL
  Filled 2022-01-09: qty 1

## 2022-01-09 MED ORDER — SODIUM CHLORIDE 0.9 % IV BOLUS
1000.0000 mL | Freq: Once | INTRAVENOUS | Status: AC
Start: 2022-01-09 — End: 2022-01-10
  Administered 2022-01-10: 1000 mL via INTRAVENOUS

## 2022-01-09 MED ORDER — ONDANSETRON 4 MG PO TBDP
4.0000 mg | ORAL_TABLET | Freq: Once | ORAL | Status: AC
  Administered 2022-01-09: 4 mg via ORAL
  Filled 2022-01-09: qty 1

## 2022-01-09 MED ORDER — ONDANSETRON 4 MG PO TBDP
4.0000 mg | ORAL_TABLET | Freq: Once | ORAL | Status: AC | PRN
Start: 2022-01-09 — End: 2022-01-09
  Administered 2022-01-09: 4 mg via ORAL
  Filled 2022-01-09: qty 1

## 2022-01-09 MED ORDER — ACETAMINOPHEN 325 MG PO TABS
650.0000 mg | ORAL_TABLET | Freq: Once | ORAL | Status: AC | PRN
Start: 2022-01-09 — End: 2022-01-09
  Administered 2022-01-09: 650 mg via ORAL
  Filled 2022-01-09: qty 2

## 2022-01-09 NOTE — ED Triage Notes (Signed)
Per EMS- patient states she woke with upper abdominal pain, diarrhea x 2 episodes and vomiting x 1 this AM. ?Patient states she is now having lower abdominal pain ?

## 2022-01-09 NOTE — ED Provider Triage Note (Signed)
Emergency Medicine Provider Triage Evaluation Note ? ?Yolanda Payne , a 26 y.o. female  was evaluated in triage.  Pt complains of N/V/D, upper abd pain.  Symptoms started acutely this morning.  States that nausea comes and goes in waves.  No urinary symptoms.  No treatments for her symptoms prior to arrival.  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  ? ?Review of Systems  ?Positive: Nausea, vomiting, diarrhea ?Negative: Fever ? ?Physical Exam  ?BP 108/68 (BP Location: Left Arm)   Pulse 68   Temp 99.2 ?F (37.3 ?C) (Oral)   Resp 16   Ht 5\' 3"  (1.6 m)   Wt 93 kg   LMP 12/13/2021   SpO2 99%   BMI 36.31 kg/m?  ?Gen:   Awake, no distress   ?Resp:  Normal effort  ?MSK:   Moves extremities without difficulty  ?Other:  Mild epigastric tenderness ? ?Medical Decision Making  ?Medically screening exam initiated at 11:15 AM.  Appropriate orders placed.  Yolanda Payne was informed that the remainder of the evaluation will be completed by another provider, this initial triage assessment does not replace that evaluation, and the importance of remaining in the ED until their evaluation is complete. ? ? ?  ?Yolanda Claude, PA-C ?01/09/22 1117 ? ?

## 2022-01-10 DIAGNOSIS — A084 Viral intestinal infection, unspecified: Secondary | ICD-10-CM | POA: Diagnosis not present

## 2022-01-10 MED ORDER — ONDANSETRON 4 MG PO TBDP: 4.0000 mg | ORAL_TABLET | Freq: Three times a day (TID) | ORAL | 0 refills | Status: DC | PRN

## 2022-01-10 MED ORDER — DICYCLOMINE HCL 20 MG PO TABS: 20.0000 mg | ORAL_TABLET | Freq: Two times a day (BID) | ORAL | 0 refills | Status: DC

## 2022-01-10 NOTE — Discharge Instructions (Signed)
You were evaluated in the Emergency Department and after careful evaluation, we did not find any emergent condition requiring admission or further testing in the hospital. ? ?Your work-up today was overall reassuring.  I suspect her symptoms are due to a viral stomach bug.  Please make sure to keep drinking plenty of fluids.  Use the nausea medication as needed.  You may also use Bentyl for abdominal pain.  Please follow the bland diet for the next few days (see handout for more information) and advance diet slowly. ? ?Please return to the Emergency Department if you experience any worsening of your condition.  We encourage you to follow up with a primary care provider.  Thank you for allowing [place] to be a part of your care. ? ?

## 2022-01-10 NOTE — ED Provider Notes (Signed)
?[facility]-EMERGENCY DEPT ?Provider Note ? ? ?CSN: [id number] ?Arrival date & time: 01/09/22  1022 ? ?  ? ?History ? ?Chief Complaint  ?Patient presents with  ? Abdominal Pain  ? Emesis  ? Diarrhea  ? ? ?Yolanda Payne is a 26 y.o. female. ? ?HPI ?26 year old female with a history of back pain with sciatica presents to the ER with complaints of epigastric pain, diarrhea and 2 episodes of nonbloody nonbilious emesis which started this morning.  She is not having generalized abdominal pain.  She was also noted to be febrile with a temperature of 100.7 in triage.  No known sick contacts.  She does still have a gallbladder.  Denies any dysuria, hematuria.  She has not had anything for her symptoms.  She denies any chest pain or shortness of breath. ?  ? ?Home Medications ?Prior to Admission medications   ?Medication Sig Start Date End Date Taking? Authorizing Provider  ?dicyclomine (BENTYL) 20 MG tablet Take 1 tablet (20 mg total) by mouth 2 (two) times daily. 01/10/22  Yes Mare FerrariBelaya, Madeeha Costantino A, PA-C  ?ondansetron (ZOFRAN-ODT) 4 MG disintegrating tablet Take 1 tablet (4 mg total) by mouth every 8 (eight) hours as needed for nausea or vomiting. 01/10/22  Yes Mare FerrariBelaya, Toniqua Melamed A, PA-C  ?valACYclovir (VALTREX) 500 MG tablet Take 1 tablet (500 mg total) by mouth daily. Can increase to twice a day for 5 days in the event of a recurrence ?Patient taking differently: Take 500 mg by mouth 2 (two) times daily as needed (flare ups). 10/12/21  Yes Gerrit HeckEmly, Jessica, CNM  ?valACYclovir (VALTREX) 1000 MG tablet Take 1 tablet (1,000 mg total) by mouth 2 (two) times daily. For 7 days when outbreaks occur. ?Patient not taking: Reported on 01/09/2022 12/26/21   Gerrit HeckEmly, Jessica, CNM  ?   ? ?Allergies    ?Patient has no known allergies.   ? ?Review of Systems   ?Review of Systems ?Ten systems reviewed and are negative for acute change, except as noted in the HPI.  ? ?Physical Exam ?Updated Vital Signs ?BP 120/77   Pulse 72   Temp (!)  100.7 ?F (38.2 ?C) (Oral)   Resp (!) 25   Ht 5\' 3"  (1.6 m)   Wt 93 kg   LMP 12/13/2021   SpO2 98%   BMI 36.31 kg/m?  ?Physical Exam ?Vitals and nursing note reviewed.  ?Constitutional:   ?   General: She is not in acute distress. ?   Appearance: She is well-developed.  ?HENT:  ?   Head: Normocephalic and atraumatic.  ?Eyes:  ?   Conjunctiva/sclera: Conjunctivae normal.  ?Cardiovascular:  ?   Rate and Rhythm: Normal rate and regular rhythm.  ?   Heart sounds: No murmur heard. ?Pulmonary:  ?   Effort: Pulmonary effort is normal. No respiratory distress.  ?   Breath sounds: Normal breath sounds.  ?Abdominal:  ?   Palpations: Abdomen is soft.  ?   Tenderness: There is no abdominal tenderness.  ?   Comments: Very minimal generalized abdominal tenderness.  Negative Murphy's, McBurney's.  No CVA tenderness.  No rebound or guarding.  No peritoneal signs.  ?Musculoskeletal:     ?   General: No swelling.  ?   Cervical back: Neck supple.  ?Skin: ?   General: Skin is warm and dry.  ?   Capillary Refill: Capillary refill takes less than 2 seconds.  ?Neurological:  ?   Mental Status: She is alert.  ?Psychiatric:     ?  Mood and Affect: Mood normal.  ? ? ?ED Results / Procedures / Treatments   ?Labs ?(all labs ordered are listed, but only abnormal results are displayed) ?Labs Reviewed  ?COMPREHENSIVE METABOLIC PANEL - Abnormal; Notable for the following components:  ?    Result Value  ? Glucose, Bld 126 (*)   ? All other components within normal limits  ?CBC - Abnormal; Notable for the following components:  ? WBC 12.0 (*)   ? RBC 5.22 (*)   ? Hemoglobin 15.4 (*)   ? HCT 46.4 (*)   ? All other components within normal limits  ?URINALYSIS, ROUTINE W REFLEX MICROSCOPIC - Abnormal; Notable for the following components:  ? Hgb urine dipstick MODERATE (*)   ? Ketones, ur 20 (*)   ? Bacteria, UA RARE (*)   ? All other components within normal limits  ?LIPASE, BLOOD  ?I-STAT BETA HCG BLOOD, ED ([facility], [facility], [facility] ONLY)   ? ? ?EKG ?None ? ?Radiology ?No results found. ? ?Procedures ?Procedures  ? ? ?Medications Ordered in ED ?Medications  ?ondansetron (ZOFRAN-ODT) disintegrating tablet 4 mg (4 mg Oral Given 01/09/22 1135)  ?acetaminophen (TYLENOL) tablet 650 mg (650 mg Oral Given 01/09/22 1820)  ?sodium chloride 0.9 % bolus 1,000 mL (0 mLs Intravenous Stopped 01/10/22 0114)  ?dicyclomine (BENTYL) capsule 10 mg (10 mg Oral Given 01/09/22 2326)  ?sucralfate (CARAFATE) tablet 1 g (1 g Oral Given 01/09/22 2326)  ?ondansetron (ZOFRAN-ODT) disintegrating tablet 4 mg (4 mg Oral Given 01/09/22 2327)  ? ? ?ED Course/ Medical Decision Making/ A&P ?  ?                        ?Medical Decision Making ?Amount and/or Complexity of Data Reviewed ?Labs: ordered. ? ?Risk ?OTC drugs. ?Prescription drug management. ? ? ?This patient presents to the ED for concern of epigastric/generalized abdominal pain, this involves a number of treatment options, and is a complaint that carries with it a risk of complications and morbidity.  The differential diagnosis includes viral gastroenteritis, appendicitis, cholecystitis, pancreatitis, small bowel obstruction, diverticulitis, colitis, renal stone, UTI ? ? ? ?Co morbidities: ?Discussed in HPI ? ? ?Brief History: ? ?26 year old female presenting to the ER with complaints of epigastric pain which now is more generalized.  Symptom onset this morning.  She has had several episodes of nonbloody diarrhea and 2 episodes of nonbloody nonbilious emesis.  No known sick contacts.  She was noted to be febrile in triage. ? ?EMR reviewed including pt PMHx, past surgical history and past visits to ER.  ? ?See HPI for more details ? ? ?Lab Tests: ? ?I ordered and independently interpreted labs.  The pertinent results include:   ? ?Labs notable for ?CBC with a leukocytosis of 12, could be infectious versus reactive in the setting of vomiting.  Hemoglobin of 15.4 likely in the setting of hemoconcentration.   ?CMP without any significant  electrode abnormalities, normal renal function, normal LFTs and bilirubin. ?Pregnancy is negative ?UA with moderate hemoglobin, ketonuria, rare bacteria, patient does report she is on her menstrual cycle.  She has no CVA tenderness to suggest renal stones ? ?Imaging Studies: ? ?Given no significant abdominal tenderness on exam, no CVA tenderness, do not think any CT imaging is indicated at this time.  I have low suspicion for cholecystitis given normal lab work and negative Murphy sign.  No evidence of UTI on UA, hematuria likely in the setting of menstrual cycle.  ? ? ?  Cardiac Monitoring: ? ?NA ?Medicines ordered: ? ?I ordered medication including Bentyl, Carafate, IV fluids, Zofran x2, Tylenol ?Reevaluation of the patient after these medicines showed that the patient improved ?I have reviewed the patients home medicines and have made adjustments as needed ? ? ? ?Critical Interventions: ? ?NA ? ? ?Consults: ? ?NA ? ? ? ?Reevaluation: ? ?After the interventions noted above I re-evaluated patient and found that they have :improved ? ? ?Social Determinants of Health: ?N/A ? ? ? ?Problem List / ED Course: ? ?26 year old female presenting with generalized abdominal pain.  On exam, she is minimally tender, no Murphy sign, McBurney's, no CVA tenderness.  No significant electrode abnormalities, normal LFTs and bilirubin.  She does have a mild leukocytosis of 12, could be infectious however could also be reactive in the setting of vomiting.  Overall I have low suspicion for surgical abdomen, suspect viral gastroenteritis.  She was given 1 L of IV fluids, Zofran, Bentyl and Carafate and Tylenol for fever.  On reevaluation, she notes improvement in her symptoms.  She was able to tolerate p.o fluids well.  Will send home with Zofran and Bentyl.  Educated on bland diet and slowly advancing diet as tolerated.  We discussed strict return precautions.  She voiced understanding and is agreeable.  Stable for  discharge. ? ? ?Dispostion: ? ?After consideration of the diagnostic results and the patients response to treatment, I feel that the patent would benefit from discharge with antiemetics and Bentyl with strict return precautions. ? ? ?Final Clinical Impressi

## 2022-02-26 ENCOUNTER — Telehealth: Payer: Self-pay | Admitting: *Deleted

## 2022-02-26 NOTE — Telephone Encounter (Signed)
Pt called to office with questions about Valtrex. Questions discussed and answered.

## 2022-05-03 ENCOUNTER — Ambulatory Visit: Payer: Medicare Other | Admitting: Family Medicine

## 2022-06-14 ENCOUNTER — Other Ambulatory Visit: Payer: Self-pay | Admitting: Family Medicine

## 2022-06-14 NOTE — Telephone Encounter (Signed)
Medication Refill - Medication: valACYclovir (VALTREX) 500 MG tablet [[id number]]   Pt is reporting that she is out medication today.  Has the patient contacted their pharmacy? Yes.   (Agent: If no, request that the patient contact the pharmacy for the refill. If patient does not wish to contact the pharmacy document the reason why and proceed with request.) (Agent: If yes, when and what did the pharmacy advise?)  Preferred Pharmacy (with phone number or street name):  [id number] [address], [address]  [place] [address]  Phone: [phone] Fax: [phone]  Hours: Not open 24 hours   Has the patient been seen for an appointment in the last year OR does the patient have an upcoming appointment? Yes.    Agent: Please be advised that RX refills may take up to 3 business days. We ask that you follow-up with your pharmacy.

## 2022-06-15 NOTE — Telephone Encounter (Signed)
Requested medication (s) are due for refill today: has two rx, the one she is requesting should have refills  Requested medication (s) are on the active medication list: yes  Last refill:  10/12/21 #90 with 3 RF  Future visit scheduled: no, NO SHOW 05/03/22  Notes to clinic:  prescriber not in this practice, please assess.     Requested Prescriptions  Pending Prescriptions Disp Refills   valACYclovir (VALTREX) 500 MG tablet 90 tablet 3    Sig: Take 1 tablet (500 mg total) by mouth daily. Can increase to twice a day for 5 days in the event of a recurrence     Antimicrobials:  Antiviral Agents - Anti-Herpetic Failed - 06/14/2022  5:25 PM      Failed - Valid encounter within last 12 months    Recent Outpatient Visits           4 years ago Vaginal irritation   [facility] [place], Ander Gaster, MD

## 2022-06-27 ENCOUNTER — Other Ambulatory Visit: Payer: Self-pay

## 2022-07-11 ENCOUNTER — Ambulatory Visit (INDEPENDENT_AMBULATORY_CARE_PROVIDER_SITE_OTHER): Payer: Medicare Other | Admitting: Nurse Practitioner

## 2022-07-11 ENCOUNTER — Encounter: Payer: Self-pay | Admitting: Nurse Practitioner

## 2022-07-11 DIAGNOSIS — B009 Herpesviral infection, unspecified: Secondary | ICD-10-CM | POA: Insufficient documentation

## 2022-07-11 DIAGNOSIS — A6004 Herpesviral vulvovaginitis: Secondary | ICD-10-CM

## 2022-07-11 DIAGNOSIS — A6000 Herpesviral infection of urogenital system, unspecified: Secondary | ICD-10-CM | POA: Insufficient documentation

## 2022-07-11 NOTE — Progress Notes (Signed)
                Established Patient Visit  Patient: Yolanda Payne   DOB: Jan 08, 1996   26 y.o. 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  She had questions about mode of transmission of virus, when she is contagious, and ways to manage outbreaks. I answered all her questions to her satisfaction. Maintain suppression therapy at this time  Reviewed medical, surgical, and social history today  Medications: Outpatient Medications Prior to Visit  Medication Sig   dicyclomine (BENTYL) 20 MG tablet Take 1 tablet (20 mg total) by mouth 2 (two) times daily.   valACYclovir (VALTREX) 500 MG tablet TAKE 1 TABLET DAILY. CAN INCREASE TO TWICE A DAY FOR 5 DAYS IN THE EVENT OF A RECURRENCE   [DISCONTINUED] valACYclovir (VALTREX) 1000 MG tablet Take 1 tablet (1,000 mg total) by mouth 2 (two) times daily. For 7 days when outbreaks occur.   [DISCONTINUED] ondansetron (ZOFRAN-ODT) 4 MG disintegrating tablet Take 1 tablet (4 mg total) by mouth every 8 (eight) hours as needed for nausea or vomiting. (Patient not taking: Reported on 07/11/2022)   No facility-administered medications prior to visit.   Reviewed past medical and social history.   ROS per HPI above      Objective:  BP 126/80 (BP Location: Right Arm, Patient Position: Sitting, Cuff Size: Normal)   Pulse 66   Temp (!) 97.3 F (36.3 C) (Temporal)   Ht 5\' 3"  (1.6 m)   Wt 213 lb 9.6 oz (96.9 kg)   SpO2 97%   BMI 37.84 kg/m      Physical Exam Vitals reviewed.  Cardiovascular:     Rate and Rhythm: Normal rate.     Pulses: Normal pulses.  Pulmonary:     Effort: Pulmonary effort is normal.   Neurological:     Mental Status: She is alert and oriented to person, place, and time.  Psychiatric:        Mood and Affect: Mood normal.        Behavior: Behavior normal.        Thought Content: Thought content normal.     No results found for any visits on 07/11/22.    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  She had questions about mode of transmission of virus, when she is contagious, and ways to manage outbreaks. I answered all her questions to her satisfaction. Maintain suppression therapy at this time      Return in about 3 months (around 10/11/2022) for CPE (fasting).     Wilfred Lacy, NP

## 2022-07-11 NOTE — Assessment & Plan Note (Signed)
1st outbreak 10/2019 Had 4outbreaks so far this year, due to missing valtrex dose Use of suppression therapy to avoid frequent outbreaks.  She had questions about mode of transmission of virus, when she is contagious, and ways to manage outbreaks. I answered all her questions to her satisfaction. Maintain suppression therapy at this time

## 2022-07-11 NOTE — Patient Instructions (Signed)
Genital Herpes Genital herpes is a common sexually transmitted infection (STI) that is caused by a virus. The virus spreads from person to person through contact with a sore, infected saliva, or infected skin. The virus can cause itching, blisters, and sores around the genitals or rectum. During an outbreak of infection, symptoms may last for several days and then go away. However, the virus remains in the body, so more outbreaks may happen in the future. The time between outbreaks varies and can be from months to years. Genital herpes can affect anyone. It is particularly concerning for pregnant women because the virus can be passed to the baby during delivery. Genital herpes is also a concern for people who have a weak disease-fighting system (immune system). What are the causes? This condition is caused by the herpes simplex virus, type 1 or type 2 (HSV-1 or HSV-2). The virus may spread through: Sexual contact with an infected person, including vaginal, anal, and oral sex. Contact with a herpes sore. The skin. This means that you can get herpes from an infected partner even if there are no blisters or sores present. Your partner may not know that he or she is infected. What increases the risk? You are more likely to develop this condition if: You have sex with many partners. You do not use latex or polyurethane condoms during sex. What are the signs or symptoms? Most people do not have symptoms or they have mild symptoms that may be mistaken for other skin problems. Symptoms may include: Small, red bumps near the genitals, rectum, or mouth. These bumps turn into blisters and then sores. Flu-like (influenza-like) symptoms, including: Fever. Body aches. Swollen lymph nodes. Headache. Painful urination. Pain and itching in the genital area or rectal area. Vaginal discharge. Tingling or shooting pain in the legs and buttocks. Generally, symptoms are more severe and last longer during the first  (primary) outbreak. Influenza-like symptoms are also more common during the primary outbreak. How is this diagnosed? This condition may be diagnosed based on: A physical exam. Your medical history. Blood tests. A test of a fluid sample (culture) from an open sore. How is this treated? There is no cure for this condition, but treatment with antiviral medicines can do the following: Speed up healing and relieve symptoms. Help to reduce the spread of the virus to sexual partners. Limit the chance of future outbreaks, or make future outbreaks shorter. Lessen symptoms of future outbreaks. Your health care provider may also recommend over-the-counter medicines to help with pain and itching. Follow these instructions at home: If you have an outbreak:  Keep the affected areas dry and clean. Avoid rubbing or touching blisters and sores. If you do touch blisters or sores: Wash your hands thoroughly with soap and water for at least 20 seconds. If soap and water are not available, use an alcohol-based hand sanitizer. Do not touch your eyes afterward. Sexual activity Do not have sexual contact during active outbreaks. Practice safe sex. Herpes can spread even if your partner does not have blisters or sores. Latex or polyurethane condoms and female condoms may help prevent the spread of the herpes virus. Managing pain and discomfort If directed, put ice on the painful area. To do this: Put ice in a plastic bag. Place a towel between your skin and the bag. Leave the ice on for 20 minutes, 2-3 times a day. Remove the ice if your skin turns bright red. This is very important. If you cannot feel pain, heat, or   cold, you have a greater risk of damage to the area. If told, take a cool sitz bath to help relieve pain or itching. A sitz bath is a water bath that you take while sitting down in water that is deep enough to cover your hips and buttocks. General instructions Take over-the-counter and  prescription medicines only as told by your health care provider. If you were prescribed an antiviral medicine, use it as told by your health care provider. Do not stop using the antiviral even if you start to feel better. Keep all follow-up visits. This is important. How is this prevented? Use condoms. Although you can get genital herpes during sexual contact even with the use of a condom, a condom can provide some protection. Avoid having multiple sexual partners. Talk with your sexual partner about any symptoms either of you may have. Also, talk with your partner about any history of STIs. Do not have sexual contact if you have active symptoms of genital herpes. Contact a health care provider if: Your symptoms are not improving with medicine. Your symptoms return, or you have new symptoms. You have a fever. You have abdominal pain. You have redness, swelling, or pain in your eye. You notice new sores on other parts of your body. You have had herpes and you become pregnant or plan to become pregnant. Get help right away if: You have symptoms of viral meningitis. This is rare but may happen if the virus spreads to the brain. Symptoms may include: Severe headache or stiff neck. Muscle aches. Nausea and vomiting. Sensitivity to light. Summary Genital herpes is a common sexually transmitted infection (STI) that is caused by the herpes simplex virus, type 1 or type 2 (HSV-1 or HSV-2). These viruses are most often spread through sexual contact with an infected person. You are more likely to develop this condition if you have sex with many partners or you do not use condoms during sex. Most people do not have symptoms or have mild symptoms that may be mistaken for other skin problems. Symptoms occur as outbreaks that may happen months or years apart. There is no cure for this condition, but treatment with oral antiviral medicines can reduce symptoms, reduce the chance of spreading the virus to  a partner, prevent future outbreaks, or shorten future outbreaks. This information is not intended to replace advice given to you by your health care provider. Make sure you discuss any questions you have with your health care provider. Document Revised: 06/01/2021 Document Reviewed: 06/01/2021 Elsevier Patient Education  2023 Elsevier Inc.  

## 2022-07-19 ENCOUNTER — Telehealth: Payer: Self-pay | Admitting: Nurse Practitioner

## 2022-07-19 NOTE — Telephone Encounter (Signed)
Left message for patient to call back and schedule Medicare Annual Wellness Visit (AWV) in office.   If not able to come in office, please offer to do virtually or by telephone.  Left office number and my jabber [phone].  AWVI eligible as of 09/11/2019   Please schedule at anytime with Nurse Health Advisor.

## 2022-08-09 ENCOUNTER — Ambulatory Visit
Admission: EM | Admit: 2022-08-09 | Discharge: 2022-08-09 | Disposition: A | Discharge status: Home / Self Care | Payer: Medicare Other | Attending: Physician Assistant | Admitting: Physician Assistant

## 2022-08-09 DIAGNOSIS — Z20822 Contact with and (suspected) exposure to covid-19: Secondary | ICD-10-CM | POA: Diagnosis present

## 2022-08-09 LAB — RESP PANEL BY RT-PCR (FLU A&B, COVID) ARPGX2
Influenza A by PCR: NEGATIVE
Influenza B by PCR: NEGATIVE
SARS Coronavirus 2 by RT PCR: NEGATIVE

## 2022-08-09 NOTE — ED Triage Notes (Signed)
Pt c/o covid exposure via mother. Now has nasal drainage, cough, onset ~ 2 days ago.

## 2022-08-09 NOTE — ED Provider Notes (Signed)
[facility]    CSN: [id number] Arrival date & time: 08/09/22  0917      History   Chief Complaint Chief Complaint  Patient presents with   Headache    HPI Maddilynn Esperanza is a 26 y.o. female.   Here today for evaluation of headache after known COVID exposure.  She reports she has had some congestion and cough.  She denies  sore throat or fever. She has not had any nausea, vomiting or diarrhea.   The history is provided by the patient.    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 Authorizing Provider  dicyclomine (BENTYL) 20 MG tablet Take 1 tablet (20 mg total) by mouth 2 (two) times daily. 01/10/22   Mare Ferrari, PA-C  valACYclovir (VALTREX) 500 MG tablet TAKE 1 TABLET DAILY. CAN INCREASE TO TWICE A DAY FOR 5 DAYS IN THE EVENT OF A RECURRENCE 06/29/22   Gerrit Heck, CNM    Family History Family History  Problem Relation Age of Onset   Healthy Mother    Healthy Father    Hypertension Father     Social History Social History   Tobacco Use   Smoking status: Never   Smokeless tobacco: Never  Vaping Use   Vaping Use: Never used  Substance Use Topics   Alcohol use: Not Currently   Drug use: No     Allergies   Patient has no known allergies.   Review of Systems Review of Systems  Constitutional:  Negative for chills and fever.  HENT:  Positive for congestion. Negative for ear pain and sore throat.   Eyes:  Negative for discharge and redness.   Respiratory:  Positive for cough. Negative for shortness of breath and wheezing.   Gastrointestinal:  Negative for abdominal pain, diarrhea, nausea and vomiting.     Physical Exam Triage Vital Signs ED Triage Vitals  Enc Vitals Group     BP      Pulse      Resp      Temp      Temp src      SpO2      Weight      Height      Head Circumference      Peak Flow      Pain Score      Pain Loc      Pain Edu?      Excl. in GC?    No data found.  Updated Vital Signs BP 114/68 (BP Location: Left Arm)   Pulse 66   Temp 98.1 F (36.7 C) (Oral)   Resp 16   SpO2 99%      Physical Exam Vitals and nursing note  reviewed.  Constitutional:      General: She is not in acute distress.    Appearance: Normal appearance. She is not ill-appearing.  HENT:     Head: Normocephalic and atraumatic.     Nose: Congestion present.     Mouth/Throat:     Mouth: Mucous membranes are moist.     Pharynx: No oropharyngeal exudate or posterior oropharyngeal erythema.  Eyes:     Conjunctiva/sclera: Conjunctivae normal.  Cardiovascular:     Rate and Rhythm: Normal rate and regular rhythm.     Heart sounds: Normal heart sounds. No murmur heard. Pulmonary:     Effort: Pulmonary effort is normal. No respiratory distress.     Breath sounds: Normal breath sounds. No wheezing, rhonchi or rales.  Skin:    General: Skin is warm and dry.  Neurological:     Mental Status: She is alert.  Psychiatric:        Mood and Affect: Mood normal.        Thought Content: Thought content normal.      UC Treatments / Results  Labs (all labs ordered are listed, but only abnormal results are displayed) Labs Reviewed  RESP PANEL BY RT-PCR (FLU A&B, COVID) [id number]    EKG   Radiology No results found.  Procedures Procedures (including critical care time)  Medications Ordered in UC Medications - No data to display  Initial Impression / Assessment and Plan / UC Course  I have reviewed the triage vital  signs and the nursing notes.  Pertinent labs & imaging results that were available during my care of the patient were reviewed by me and considered in my medical decision making (see chart for details).    Will screen for covid and flu. Recommend symptomatic treatment while awaiting results or sooner follow up with any further concerns.   Final Clinical Impressions(s) / UC Diagnoses   Final diagnoses:  Exposure to COVID-19 virus   Discharge Instructions   None    ED Prescriptions   None    PDMP not reviewed this encounter.   Tomi Bamberger, PA-C 08/09/22 1358

## 2022-08-13 ENCOUNTER — Ambulatory Visit (INDEPENDENT_AMBULATORY_CARE_PROVIDER_SITE_OTHER): Payer: Medicare Other

## 2022-08-13 VITALS — Ht 63.0 in | Wt 215.0 lb

## 2022-08-13 DIAGNOSIS — Z0000 Encounter for general adult medical examination without abnormal findings: Secondary | ICD-10-CM

## 2022-08-13 NOTE — Progress Notes (Signed)
I connected with Yolanda Payne today by telephone and verified that I am speaking with the correct person using two identifiers. Location patient: home Location provider: work Persons participating in the virtual visit: [name], Renn LPN.   I discussed the limitations, risks, security and privacy concerns of performing an evaluation and management service by telephone and the availability of in person appointments. I also discussed with the patient that there may be a patient responsible charge related to this service. The patient expressed understanding and verbally consented to this telephonic visit.    Interactive audio and video telecommunications were attempted between this provider and patient, however failed, due to patient having technical difficulties OR patient did not have access to video capability.  We continued and completed visit with audio only.     Vital signs may be patient reported or missing.  Subjective:   Yolanda Payne is a 26 y.o. female who presents for Medicare Annual (Subsequent) preventive examination.  Review of Systems     Cardiac Risk Factors include: obesity (BMI >30kg/m2)     Objective:    Today's Vitals   08/13/22 1342  Weight: 215 lb (97.5 kg)  Height: 5\' 3"  (1.6 m)   Body mass index is 38.09 kg/m.     08/13/2022    1:44 PM 01/09/2022   11:07 AM 02/25/2017    3:14 PM 12/30/2016   12:35 PM 09/07/2016    1:44 AM  Advanced Directives  Does Patient Have a Medical Advance Directive? No No No No No  Would patient like information on creating a medical advance directive?  No - Patient declined No - Patient declined      Current Medications (verified) Outpatient Encounter Medications as of 08/13/2022  Medication Sig   dicyclomine (BENTYL) 20 MG tablet Take 1 tablet (20 mg total) by mouth 2 (two) times daily.   valACYclovir (VALTREX) 500 MG tablet TAKE 1 TABLET DAILY. CAN INCREASE TO TWICE A DAY FOR 5 DAYS IN THE EVENT  OF A RECURRENCE   No facility-administered encounter medications on file as of 08/13/2022.    Allergies (verified) Patient has no known allergies.   History: Past Medical History:  Diagnosis Date   HSV (herpes simplex virus) infection    Past Surgical History:  Procedure Laterality Date   WISDOM TOOTH EXTRACTION     Family History  Problem Relation Age of Onset   Healthy Mother    Healthy Father    Hypertension Father    Social History   Socioeconomic History   Marital status: Single    Spouse name: Not on file   Number of children: Not on file   Years of education: Not on file   Highest education level: Not on file  Occupational History   Not on file  Tobacco Use   Smoking status: Never   Smokeless tobacco: Never  Vaping Use   Vaping Use: Never used  Substance and Sexual Activity   Alcohol use: Not Currently   Drug use: No   Sexual activity: Not Currently    Partners: Male    Birth control/protection: None  Other Topics Concern   Not on file  Social History Narrative   Not on file   Social Determinants of Health   Financial Resource Strain: Low Risk  (08/13/2022)   Overall Financial Resource Strain (CARDIA)    Difficulty of Paying Living Expenses: Not hard at all  Food Insecurity: No Food Insecurity (08/13/2022)   Hunger Vital Sign  Worried About Programme researcher, broadcasting/film/video in the Last Year: Never true    Ran Out of Food in the Last Year: Never true  Transportation Needs: No Transportation Needs (08/13/2022)   PRAPARE - Administrator, Civil Service (Medical): No    Lack of Transportation (Non-Medical): No  Physical Activity: Inactive (08/13/2022)   Exercise Vital Sign    Days of Exercise per Week: 0 days    Minutes of Exercise per Session: 0 min  Stress: No Stress Concern Present (08/13/2022)   [employer] of Occupational Health - Occupational Stress Questionnaire    Feeling of Stress : Not at all  Social Connections: Not on file     Tobacco Counseling Counseling given: Not Answered   Clinical Intake:  Pre-visit preparation completed: Yes  Pain : No/denies pain     Nutritional Status: BMI > 30  Obese Nutritional Risks: None Diabetes: No  How often do you need to have someone help you when you read instructions, pamphlets, or other written materials from your doctor or pharmacy?: 1 - Never  Diabetic? no  Interpreter Needed?: No  Information entered by :: NAllen LPN   Activities of Daily Living    08/13/2022    1:45 PM 08/09/2022    8:58 AM  In your present state of health, do you have any difficulty performing the following activities:  Hearing? 0 0  Vision? 0 0  Difficulty concentrating or making decisions? 0 0  Walking or climbing stairs? 0 0  Dressing or bathing? 0 0  Doing errands, shopping? 0 0  Preparing Food and eating ? N N  Using the Toilet? N N  In the past six months, have you accidently leaked urine? N N  Do you have problems with loss of bowel control? N N  Managing your Medications? N N  Managing your Finances? N N  Housekeeping or managing your Housekeeping? N N    Patient Care Team: Nche, Bonna Gains, NP as PCP - General (Internal Medicine)  Indicate any recent Medical Services you may have received from other than [facility] providers in the past year (date may be approximate).     Assessment:   This is a routine wellness examination for Yolanda Payne.  Hearing/Vision screen Vision Screening - Comments:: No regular eye exams  Dietary issues and exercise activities discussed: Current Exercise Habits: The patient does not participate in regular exercise at present   Goals Addressed             This Visit's Progress    Patient Stated       08/13/2022, no goals       Depression Screen    08/13/2022    1:45 PM 07/11/2022    9:41 AM 12/26/2021    8:21 AM 05/22/2018    9:42 AM  PHQ 2/9 Scores  PHQ - 2 Score 0 2 0 0  PHQ- 9 Score  2 0 1    Fall Risk     08/13/2022    1:45 PM 08/09/2022    8:58 AM  Fall Risk   Falls in the past year? 0 0  Number falls in past yr: 0 0  Injury with Fall? 0 0  Risk for fall due to : Medication side effect   Follow up Falls prevention discussed;Education provided;Falls evaluation completed     FALL RISK PREVENTION PERTAINING TO THE HOME:  Any stairs in or around the home? No  If so, are there any without handrails? N/a  Home free of loose throw rugs in walkways, pet beds, electrical cords, etc? Yes  Adequate lighting in your home to reduce risk of falls? Yes   ASSISTIVE DEVICES UTILIZED TO PREVENT FALLS:  Life alert? No  Use of a cane, walker or w/c? No  Grab bars in the bathroom? Yes  Shower chair or bench in shower? No  Elevated toilet seat or a handicapped toilet? No   TIMED UP AND GO:  Was the test performed? No .      Cognitive Function:        08/13/2022    1:46 PM  6CIT Screen  What Year? 0 points  What month? 0 points  What time? 0 points  Count back from 20 0 points  Months in reverse 4 points  Repeat phrase 0 points  Total Score 4 points    Immunizations Immunization History  Administered Date(s) Administered   Influenza-Unspecified 06/10/2022   PFIZER(Purple Top)SARS-COV-2 Vaccination 03/21/2020, 04/11/2020, 10/30/2020    TDAP status: Due, Education has been provided regarding the importance of this vaccine. Advised may receive this vaccine at local pharmacy or Health Dept. Aware to provide a copy of the vaccination record if obtained from local pharmacy or Health Dept. Verbalized acceptance and understanding.  Flu Vaccine status: Up to date  Pneumococcal vaccine status: Up to date  Covid-19 vaccine status: Completed vaccines  Qualifies for Shingles Vaccine? No   Zostavax completed  n/a   Shingrix Completed?: n/a  Screening Tests Health Maintenance  Topic Date Due   Medicare Annual Wellness (AWV)  Never done   DTaP/Tdap/Td (1 - Tdap) Never done   COVID-19  Vaccine (4 - 2023-24 season) 05/11/2022   PAP-Cervical Cytology Screening  11/16/2023   PAP SMEAR-Modifier  11/16/2023   INFLUENZA VACCINE  Completed   Hepatitis C Screening  Completed   HIV Screening  Completed   HPV VACCINES  Aged Out    Health Maintenance  Health Maintenance Due  Topic Date Due   Medicare Annual Wellness (AWV)  Never done   DTaP/Tdap/Td (1 - Tdap) Never done   COVID-19 Vaccine (4 - 2023-24 season) 05/11/2022    Colorectal cancer screening: No longer required.   Mammogram status: No longer required due to age.  Bone Density status: n/a  Lung Cancer Screening: (Low Dose CT Chest recommended if Age 110-80 years, 30 pack-year currently smoking OR have quit w/in 15years.) does not qualify.   Lung Cancer Screening Referral: no  Additional Screening:  Hepatitis C Screening: does qualify; Completed 11/15/2020  Vision Screening: Recommended annual ophthalmology exams for early detection of glaucoma and other disorders of the eye. Is the patient up to date with their annual eye exam?  No  Who is the provider or what is the name of the office in which the patient attends annual eye exams? none If pt is not established with a provider, would they like to be referred to a provider to establish care? No .   Dental Screening: Recommended annual dental exams for proper oral hygiene  Community Resource Referral / Chronic Care Management: CRR required this visit?  No   CCM required this visit?  No      Plan:     I have personally reviewed and noted the following in the patient's chart:   Medical and social history Use of alcohol, tobacco or illicit drugs  Current medications and supplements including opioid prescriptions. Patient is not currently taking opioid prescriptions. Functional ability and status Nutritional status Physical activity  Advanced directives List of other physicians Hospitalizations, surgeries, and ER visits in previous 12  months Vitals Screenings to include cognitive, depression, and falls Referrals and appointments  In addition, I have reviewed and discussed with patient certain preventive protocols, quality metrics, and best practice recommendations. A written personalized care plan for preventive services as well as general preventive health recommendations were provided to patient.     Barb Merino, LPN   [id number]   Nurse Notes: none  Due to this being a virtual visit, the after visit summary with patients personalized plan was offered to patient via mail or my-chart. Patient would like to access on my-chart

## 2022-08-13 NOTE — Patient Instructions (Addendum)
Yolanda Payne , Thank you for taking time to come for your Medicare Wellness Visit. I appreciate your ongoing commitment to your health goals. Please review the following plan we discussed and let me know if I can assist you in the future.   These are the goals we discussed:  Goals      Patient Stated     08/13/2022, no goals        This is a list of the screening recommended for you and due dates:  Health Maintenance  Topic Date Due   DTaP/Tdap/Td vaccine (1 - Tdap) Never done   COVID-19 Vaccine (4 - 2023-24 season) 05/11/2022   Medicare Annual Wellness Visit  08/14/2023   Pap Smear  11/16/2023   Pap Smear  11/16/2023   Flu Shot  Completed   Hepatitis C Screening: USPSTF Recommendation to screen - Ages 18-79 yo.  Completed   HIV Screening  Completed   HPV Vaccine  Aged Out    Advanced directives: Advance directive discussed with you today.   Conditions/risks identified: none  Next appointment: Follow up in one year for your annual wellness visit.   Preventive Care 21-58 Years Old, Female Preventive care refers to lifestyle choices and visits with your health care provider that can promote health and wellness. Preventive care visits are also called wellness exams. What can I expect for my preventive care visit? Counseling During your preventive care visit, your health care provider may ask about your: Medical history, including: Past medical problems. Family medical history. Pregnancy history. Current health, including: Menstrual cycle. Method of birth control. Emotional well-being. Home life and relationship well-being. Sexual activity and sexual health. Lifestyle, including: Alcohol, nicotine or tobacco, and drug use. Access to firearms. Diet, exercise, and sleep habits. Work and work Statistician. Sunscreen use. Safety issues such as seatbelt and bike helmet use. Physical exam Your health care provider may check your: Height and weight. These may be used to  calculate your BMI (body mass index). BMI is a measurement that tells if you are at a healthy weight. Waist circumference. This measures the distance around your waistline. This measurement also tells if you are at a healthy weight and may help predict your risk of certain diseases, such as type 2 diabetes and high blood pressure. Heart rate and blood pressure. Body temperature. Skin for abnormal spots. What immunizations do I need? Vaccines are usually given at various ages, according to a schedule. Your health care provider will recommend vaccines for you based on your age, medical history, and lifestyle or other factors, such as travel or where you work. What tests do I need? Screening Your health care provider may recommend screening tests for certain conditions. This may include: Pelvic exam and Pap test. Lipid and cholesterol levels. Diabetes screening. This is done by checking your blood sugar (glucose) after you have not eaten for a while (fasting). Hepatitis B test. Hepatitis C test. HIV (human immunodeficiency virus) test. STI (sexually transmitted infection) testing, if you are at risk. BRCA-related cancer screening. This may be done if you have a family history of breast, ovarian, tubal, or peritoneal cancers. Talk with your health care provider about your test results, treatment options, and if necessary, the need for more tests. Follow these instructions at home: Eating and drinking  Eat a healthy diet that includes fresh fruits and vegetables, whole grains, lean protein, and low-fat dairy products. Take vitamin and mineral supplements as recommended by your health care provider. Do not drink alcohol if:  Your health care provider tells you not to drink. You are pregnant, may be pregnant, or are planning to become pregnant. If you drink alcohol: Limit how much you have to 0-1 drink a day. Know how much alcohol is in your drink. In the [place] one drink equals one 12 oz bottle  of beer (355 mL), one 5 oz glass of wine (148 mL), or one 1 oz glass of hard liquor (44 mL). Lifestyle Brush your teeth every morning and night with fluoride toothpaste. Floss one time each day. Exercise for at least 30 minutes 5 or more days each week. Do not use any products that contain nicotine or tobacco. These products include cigarettes, chewing tobacco, and vaping devices, such as e-cigarettes. If you need help quitting, ask your health care provider. Do not use drugs. If you are sexually active, practice safe sex. Use a condom or other form of protection to prevent STIs. If you do not wish to become pregnant, use a form of birth control. If you plan to become pregnant, see your health care provider for a prepregnancy visit. Find healthy ways to manage stress, such as: Meditation, yoga, or listening to music. Journaling. Talking to a trusted person. Spending time with friends and family. Minimize exposure to UV radiation to reduce your risk of skin cancer. Safety Always wear your seat belt while driving or riding in a vehicle. Do not drive: If you have been drinking alcohol. Do not ride with someone who has been drinking. If you have been using any mind-altering substances or drugs. While texting. When you are tired or distracted. Wear a helmet and other protective equipment during sports activities. If you have firearms in your house, make sure you follow all gun safety procedures. Seek help if you have been physically or sexually abused. What's next? Go to your health care provider once a year for an annual wellness visit. Ask your health care provider how often you should have your eyes and teeth checked. Stay up to date on all vaccines. This information is not intended to replace advice given to you by your health care provider. Make sure you discuss any questions you have with your health care provider. Document Revised: 02/22/2021 Document Reviewed: 02/22/2021 Elsevier  Patient Education  Yolanda Payne.

## 2022-10-12 ENCOUNTER — Encounter: Payer: Medicare Other | Admitting: Nurse Practitioner

## 2022-11-14 ENCOUNTER — Encounter: Payer: Medicare Other | Admitting: Nurse Practitioner

## 2022-11-14 ENCOUNTER — Encounter: Payer: Self-pay | Admitting: Nurse Practitioner

## 2022-11-14 NOTE — Progress Notes (Signed)
Not seen due to insurance issuesThis encounter was created in error - please disregard.

## 2022-11-14 NOTE — Progress Notes (Deleted)
Complete physical exam  Patient: Yolanda Payne   DOB: 1996/07/23   26 y.o. Female  MRN: [id number] Visit Date: 11/14/2022  Subjective:    Chief Complaint  Patient presents with   Annual Exam    Fasting - no     Yolanda Payne is a 27 y.o. female who presents today for a complete physical exam. She reports consuming a {diet types:17450} diet. {types:19826} She generally feels {DESC; WELL/FAIRLY WELL/POORLY:18703}. She reports sleeping {DESC; WELL/FAIRLY WELL/POORLY:18703}. She {does/does not:200015} have additional problems to discuss today.  Vision:{YESNOVISION:27266} Dental:{YESNODENTAL:27267} STD Screen:{YESNO:27265}  BP Readings from Last 3 Encounters:  08/09/22 114/68  07/11/22 126/80  01/10/22 118/88   Wt Readings from Last 3 Encounters:  08/13/22 215 lb (97.5 kg)  07/11/22 213 lb 9.6 oz (96.9 kg)  01/09/22 205 lb (93 kg)   Most recent fall risk assessment:    11/14/2022   10:51 AM  [place] in the past year? 0  Number falls in past yr: 0  Injury with Fall? 0  Risk for fall due to : No Fall Risks  Follow up Falls evaluation completed     Depression screen:{yes/no/not screened:19829}  Most recent depression screenings:    08/13/2022    1:45 PM 07/11/2022    9:41 AM  PHQ 2/9 Scores  PHQ - 2 Score 0 2  PHQ- 9 Score  2    HPI  No problem-specific Assessment & Plan notes found for this encounter.   Past Medical History:  Diagnosis Date   HSV (herpes simplex virus) infection    Past Surgical History:  Procedure Laterality Date   WISDOM TOOTH EXTRACTION     Social History   Socioeconomic History   Marital status: Single    Spouse name: Not on file   Number of children: Not on file   Years of education: Not on file   Highest education level: Not on file  Occupational History   Not on file  Tobacco Use   Smoking status: Never   Smokeless tobacco: Never  Vaping Use   Vaping Use: Never used  Substance and Sexual Activity    Alcohol use: Not Currently   Drug use: No   Sexual activity: Not Currently    Partners: Male    Birth control/protection: None  Other Topics Concern   Not on file  Social History Narrative   Not on file   Social Determinants of Health   Financial Resource Strain: Low Risk  (08/13/2022)   Overall Financial Resource Strain (CARDIA)    Difficulty of Paying Living Expenses: Not hard at all  Food Insecurity: No Food Insecurity (08/13/2022)   Hunger Vital Sign    Worried About Running Out of Food in the Last Year: Never true    [place] in the Last Year: Never true  Transportation Needs: No Transportation Needs (08/13/2022)   PRAPARE - Hydrologist (Medical): No    Lack of Transportation (Non-Medical): No  Physical Activity: Inactive (08/13/2022)   Exercise Vital Sign    Days of Exercise per Week: 0 days    Minutes of Exercise per Session: 0 min  Stress: No Stress Concern Present (08/13/2022)   [place]    Feeling of Stress : Not at all  Social Connections: Not on file  Intimate Partner Violence: Not on file   Family Status  Relation Name Status   Mother  Alive  Father  Alive   MGM  Alive   MGF  Deceased   PGM  Deceased   Family History  Problem Relation Age of Onset   Healthy Mother    Healthy Father    Hypertension Father    No Known Allergies  Patient Care Team: Kaelan Emami, Charlene Brooke, NP as PCP - General (Internal Medicine)   Medications: Outpatient Medications Prior to Visit  Medication Sig   valACYclovir (VALTREX) 500 MG tablet TAKE 1 TABLET DAILY. CAN INCREASE TO TWICE A DAY FOR 5 DAYS IN THE EVENT OF A RECURRENCE   dicyclomine (BENTYL) 20 MG tablet Take 1 tablet (20 mg total) by mouth 2 (two) times daily. (Patient not taking: Reported on 11/14/2022)   No facility-administered medications prior to visit.    Review of Systems  {Show previous labs  (optional):23779}    Objective:  LMP 10/23/2022 (Approximate)     Physical Exam   No results found for any visits on 11/14/22.    Assessment & Plan:    Routine Health Maintenance and Physical Exam  Immunization History  Administered Date(s) Administered   Influenza-Unspecified 06/10/2022   PFIZER(Purple Top)SARS-COV-2 Vaccination 03/21/2020, 04/11/2020, 10/30/2020    Health Maintenance  Topic Date Due   DTaP/Tdap/Td (1 - Tdap) Never done   COVID-19 Vaccine (4 - 2023-24 season) 11/30/2022 (Originally 05/11/2022)   Medicare Annual Wellness (AWV)  08/14/2023   PAP-Cervical Cytology Screening  11/16/2023   PAP SMEAR-Modifier  11/16/2023   INFLUENZA VACCINE  Completed   Hepatitis C Screening  Completed   HIV Screening  Completed   HPV VACCINES  Aged Out    Discussed health benefits of physical activity, and encouraged her to engage in regular exercise appropriate for her age and condition.  Problem List Items Addressed This Visit   None  No follow-ups on file.     Yolanda Lacy, NP

## 2022-12-28 ENCOUNTER — Other Ambulatory Visit: Payer: Self-pay

## 2023-01-03 ENCOUNTER — Ambulatory Visit: Payer: Medicare Other | Admitting: Nurse Practitioner

## 2023-02-18 ENCOUNTER — Encounter: Payer: Self-pay | Admitting: Advanced Practice Midwife

## 2023-02-18 ENCOUNTER — Ambulatory Visit (INDEPENDENT_AMBULATORY_CARE_PROVIDER_SITE_OTHER): Payer: Medicare Other | Admitting: Advanced Practice Midwife

## 2023-02-18 ENCOUNTER — Other Ambulatory Visit (HOSPITAL_COMMUNITY)
Admission: RE | Admit: 2023-02-18 | Discharge: 2023-02-18 | Disposition: A | Discharge status: Home / Self Care | Payer: Medicare Other | Source: Ambulatory Visit | Attending: Advanced Practice Midwife | Admitting: Advanced Practice Midwife

## 2023-02-18 ENCOUNTER — Institutional Professional Consult (permissible substitution): Payer: Medicare Other | Admitting: Licensed Clinical Social Worker

## 2023-02-18 VITALS — BP 125/76 | HR 64 | Ht 63.0 in | Wt 210.8 lb

## 2023-02-18 DIAGNOSIS — Z01419 Encounter for gynecological examination (general) (routine) without abnormal findings: Secondary | ICD-10-CM

## 2023-02-18 DIAGNOSIS — B9689 Other specified bacterial agents as the cause of diseases classified elsewhere: Secondary | ICD-10-CM | POA: Insufficient documentation

## 2023-02-18 DIAGNOSIS — Z113 Encounter for screening for infections with a predominantly sexual mode of transmission: Secondary | ICD-10-CM | POA: Diagnosis present

## 2023-02-18 DIAGNOSIS — A6000 Herpesviral infection of urogenital system, unspecified: Secondary | ICD-10-CM

## 2023-02-18 DIAGNOSIS — Z114 Encounter for screening for human immunodeficiency virus [HIV]: Secondary | ICD-10-CM | POA: Diagnosis not present

## 2023-02-18 DIAGNOSIS — N760 Acute vaginitis: Secondary | ICD-10-CM | POA: Diagnosis not present

## 2023-02-18 HISTORY — DX: Other specified bacterial agents as the cause of diseases classified elsewhere: B96.89

## 2023-02-18 MED ORDER — VALACYCLOVIR HCL 1 G PO TABS: 1000.0000 mg | ORAL_TABLET | Freq: Every day | ORAL | 11 refills | Status: DC

## 2023-02-18 NOTE — Progress Notes (Signed)
Pt presents for AEX. Last PAP 11/15/20 Requesting PAP and STD testing  Pt c/o recurring BV  Requesting refill of Valtrex.

## 2023-02-18 NOTE — Progress Notes (Signed)
   Subjective:     Yolanda Payne is a 26 y.o. female here at [facility] for a routine exam.  Current complaints: recurrent BV, concerns about HSV transmission and taking Valtrex.  Personal health questionnaire reviewed: yes.  Do you have a primary care provider? yes Do you feel safe at home? yes  Flowsheet Row Office Visit from 02/18/2023 in [facility] Total Score 1       Health Maintenance Due  Topic Date Due   DTaP/Tdap/Td (1 - Tdap) Never done   COVID-19 Vaccine (4 - 2023-24 season) 05/11/2022     Risk factors for chronic health problems: Smoking: Alchohol/how much: Pt BMI: Body mass index is 37.34 kg/m.   Gynecologic History Patient's last menstrual period was 02/05/2023. Contraception: condoms Last Pap: 2022. Results were: normal Last mammogram: n/a.  Obstetric History OB History  Gravida Para Term Preterm AB Living  0 0 0 0 0 0  SAB IAB Ectopic Multiple Live Births  0 0 0 0 0     The following portions of the patient's history were reviewed and updated as appropriate: allergies, current medications, past family history, past medical history, past social history, past surgical history, and problem list.  Review of Systems Pertinent items noted in HPI and remainder of comprehensive ROS otherwise negative.    Objective:  BP 125/76   Pulse 64   Ht 5\' 3"  (1.6 m)   Wt 210 lb 12.8 oz (95.6 kg)   LMP 02/05/2023   BMI 37.34 kg/m   VS reviewed, nursing note reviewed,  Constitutional: well developed, well nourished, no distress HEENT: normocephalic, thyroid without enlargement or mass HEART: RRR, no murmurs rubs/gallops RESP: clear and equal to auscultation bilaterally in all lobes  Breast Exam:exam performed: right breast normal without mass, skin or nipple changes or axillary nodes, left breast normal without mass, skin or nipple changes or axillary nodes Abdomen: soft Neuro: alert and oriented x 3 Skin:  warm, dry Psych: affect normal Pelvic exam: Bimanual exam: Cervix 0/long/high, firm, anterior, neg CMT, uterus nontender, nonenlarged, adnexa without tenderness, enlargement, or mass        Assessment/Plan:   1. Routine screening for STI (sexually transmitted infection)  - HIV antibody (with reflex) - RPR - Hepatitis C Antibody - Hepatitis B Surface AntiGEN - Cervicovaginal ancillary only( [place])  2. Encounter for screening for human immunodeficiency virus (HIV)  - HIV antibody (with reflex)  3. Bacterial vaginosis --Recurrent, discussed prevention --Swab collected today  4. Encounter for annual routine gynecological examination   5. Recurrent genital HSV (herpes simplex virus) infection --Discussed prophylaxis vs intermittent treatment. Pt prefers prophylaxis and had outbreak ~ 4 months ago on 500 mg daily.  Rx for Valtrex 1000 mg daily. --Pt staying with same partner mainly because she knows they both have HSV.  Discussed healthy relationships and pt does not need to stay in relationship that is unhealthy just because of HSV.  Commended and encouraged patient to continue working on stress, taking prophylactic medications, and making healthy relationships.  - valACYclovir (VALTREX) 1000 MG tablet; Take 1 tablet (1,000 mg total) by mouth daily.  Dispense: 30 tablet; Refill: 11       Return in about 1 year (around 02/18/2024).   Sharen Counter, CNM 12:40 PM

## 2023-02-19 LAB — CERVICOVAGINAL ANCILLARY ONLY
Bacterial Vaginitis (gardnerella): NEGATIVE
Candida Glabrata: NEGATIVE
Candida Vaginitis: NEGATIVE
Chlamydia: NEGATIVE
Comment: NEGATIVE
Comment: NEGATIVE
Comment: NEGATIVE
Comment: NEGATIVE
Comment: NEGATIVE
Comment: NORMAL
Neisseria Gonorrhea: NEGATIVE
Trichomonas: NEGATIVE

## 2023-02-19 LAB — HEPATITIS C ANTIBODY: Hep C Virus Ab: NONREACTIVE

## 2023-02-19 LAB — RPR: RPR Ser Ql: NONREACTIVE

## 2023-02-19 LAB — HIV ANTIBODY (ROUTINE TESTING W REFLEX): HIV Screen 4th Generation wRfx: NONREACTIVE

## 2023-02-19 LAB — HEPATITIS B SURFACE ANTIGEN: Hepatitis B Surface Ag: NEGATIVE

## 2023-02-26 ENCOUNTER — Encounter: Payer: Self-pay | Admitting: Licensed Clinical Social Worker

## 2023-03-05 ENCOUNTER — Ambulatory Visit: Payer: Medicare Other | Admitting: Licensed Clinical Social Worker

## 2023-03-05 ENCOUNTER — Encounter: Payer: Medicare Other | Admitting: Licensed Clinical Social Worker

## 2023-03-05 DIAGNOSIS — Z91199 Patient's noncompliance with other medical treatment and regimen due to unspecified reason: Secondary | ICD-10-CM

## 2023-03-05 NOTE — BH Specialist Note (Signed)
Called pt twice. Pt no show

## 2023-03-12 ENCOUNTER — Encounter: Payer: Medicare Other | Admitting: Licensed Clinical Social Worker

## 2023-07-04 ENCOUNTER — Ambulatory Visit
Admission: EM | Admit: 2023-07-04 | Discharge: 2023-07-04 | Disposition: A | Discharge status: Home / Self Care | Payer: Medicare Other | Attending: Family Medicine | Admitting: Family Medicine

## 2023-07-04 ENCOUNTER — Other Ambulatory Visit: Payer: Self-pay

## 2023-07-04 ENCOUNTER — Encounter: Payer: Self-pay | Admitting: *Deleted

## 2023-07-04 DIAGNOSIS — H6122 Impacted cerumen, left ear: Secondary | ICD-10-CM | POA: Diagnosis not present

## 2023-07-04 DIAGNOSIS — J069 Acute upper respiratory infection, unspecified: Secondary | ICD-10-CM

## 2023-07-04 MED ORDER — PROMETHAZINE-DM 6.25-15 MG/5ML PO SYRP: 5.0000 mL | ORAL_SOLUTION | Freq: Four times a day (QID) | ORAL | 0 refills | Status: DC | PRN

## 2023-07-04 NOTE — ED Triage Notes (Signed)
Left ear fullness "muffled" x 1 month. Also c/o sore throat today. Exposed to covid at the end of September by co-worker

## 2023-07-04 NOTE — ED Provider Notes (Signed)
[facility]   [id number] 07/04/23 Arrival Time: 1631  ASSESSMENT & PLAN:  1. Impacted cerumen of left ear   2. Viral URI    Discussed typical duration of likely viral illness. Left ear improved after flushing; no pain; no signs of ear infection. OTC symptom care as needed.  Discharge Medication List as of 07/04/2023  7:07 PM     START taking these medications   Details  promethazine-dextromethorphan (PROMETHAZINE-DM) 6.25-15 MG/5ML syrup Take 5 mLs by mouth 4 (four) times daily as needed for cough., Starting Thu 07/04/2023, Normal         Follow-up Information     Nche, Bonna Gains, NP.   Specialty: Internal Medicine Why: As needed. Contact information: [address] [place] [address] [phone]                 Reviewed expectations re: course of current medical issues. Questions answered. Outlined signs and symptoms indicating need for more acute intervention. Understanding verbalized. After Visit Summary given.   SUBJECTIVE: History from: Patient. Yolanda Payne is a 27 y.o. female. Left ear fullness "muffled" x 1 month. Also c/o sore throat today. Exposed to covid at the end of September by co-worker With nasal congestion today; slight cough/clearing throat. Normal PO intake without n/v/d.  OBJECTIVE:  Vitals:   07/04/23 1747  BP: 115/75  Pulse: 70  Resp: 20  Temp: 97.9 F (36.6 C)  TempSrc: Oral  SpO2: 98%    General appearance: alert; no distress Eyes: PERRLA; EOMI; conjunctiva normal HENT: [place]; AT; with nasal congestion; L EAC with cerumen impaction (TM normal after flushing) Neck: supple  Lungs: speaks full sentences without difficulty; unlabored Extremities: no edema Skin: warm and dry Neurologic: normal gait Psychological: alert and cooperative; normal mood and affect    No Known Allergies  Past Medical History:  Diagnosis Date   HSV (herpes simplex virus) infection    Social History    Socioeconomic History   Marital status: Single    Spouse name: Not on file   Number of children: Not on file   Years of education: Not on file   Highest education level: Not on file  Occupational History   Not on file  Tobacco Use   Smoking status: Never   Smokeless tobacco: Never  Vaping Use   Vaping status: Never Used  Substance and Sexual Activity   Alcohol use: Not Currently   Drug use: No   Sexual activity: Yes    Partners: Male    Birth control/protection: None  Other Topics Concern   Not on file  Social History Narrative   Not on file   Social Determinants of Health   Financial Resource Strain: Low Risk  (08/13/2022)   Overall Financial Resource Strain (CARDIA)    Difficulty of Paying Living Expenses: Not hard at all  Food Insecurity: No Food Insecurity (08/13/2022)   Hunger Vital Sign    Worried About Running Out of Food in the Last Year: Never true    Ran Out of Food in the Last Year: Never true  Transportation Needs: No Transportation Needs (08/13/2022)   PRAPARE - Administrator, Civil Service (Medical): No    Lack of Transportation (Non-Medical): No  Physical Activity: Inactive (08/13/2022)   Exercise Vital Sign    Days of Exercise per Week: 0 days    Minutes of Exercise per Session: 0 min  Stress: No Stress Concern Present (08/13/2022)   [employer] of Occupational Health -  Occupational Stress Questionnaire    Feeling of Stress : Not at all  Social Connections: Not on file  Intimate Partner Violence: Not on file   Family History  Problem Relation Age of Onset   Healthy Mother    Healthy Father    Hypertension Father    Past Surgical History:  Procedure Laterality Date   WISDOM TOOTH EXTRACTION       Mardella Layman, MD 07/04/23 Jerene Bears

## 2023-07-05 ENCOUNTER — Other Ambulatory Visit: Payer: Self-pay

## 2024-01-22 ENCOUNTER — Ambulatory Visit

## 2024-01-22 ENCOUNTER — Other Ambulatory Visit (HOSPITAL_COMMUNITY)
Admission: RE | Admit: 2024-01-22 | Discharge: 2024-01-22 | Disposition: A | Discharge status: Home / Self Care | Source: Ambulatory Visit

## 2024-01-22 VITALS — BP 121/80 | HR 61 | Ht 63.0 in | Wt 214.8 lb

## 2024-01-22 DIAGNOSIS — Z1151 Encounter for screening for human papillomavirus (HPV): Secondary | ICD-10-CM | POA: Diagnosis not present

## 2024-01-22 DIAGNOSIS — Z1239 Encounter for other screening for malignant neoplasm of breast: Secondary | ICD-10-CM

## 2024-01-22 DIAGNOSIS — Z01419 Encounter for gynecological examination (general) (routine) without abnormal findings: Secondary | ICD-10-CM | POA: Insufficient documentation

## 2024-01-22 DIAGNOSIS — Z3169 Encounter for other general counseling and advice on procreation: Secondary | ICD-10-CM

## 2024-01-22 DIAGNOSIS — Z113 Encounter for screening for infections with a predominantly sexual mode of transmission: Secondary | ICD-10-CM | POA: Insufficient documentation

## 2024-01-22 DIAGNOSIS — Z124 Encounter for screening for malignant neoplasm of cervix: Secondary | ICD-10-CM | POA: Diagnosis not present

## 2024-01-22 MED ORDER — PREPLUS 27-1 MG PO TABS: 1.0000 | ORAL_TABLET | Freq: Every day | ORAL | 3 refills | Status: AC

## 2024-01-22 MED ORDER — VALACYCLOVIR HCL 1 G PO TABS: 1000.0000 mg | ORAL_TABLET | Freq: Every day | ORAL | 3 refills | Status: DC

## 2024-01-22 NOTE — Progress Notes (Signed)
 GYNECOLOGY OFFICE VISIT NOTE-WELL WOMAN EXAM  History:    Yolanda Payne is a 28 year old here today for well woman exam.  She reports LMP was April 19th and was normal.  She reports cramping and breast tenderness is typical.  She states it lasts 6 days and is light to heavy flow. She dnies passing of clots and finds cramping is relieved with tylenol .   Birth Control:  None  Reproductive Concerns Sexually Active: Yes Partners Type: Female Number of partners in last year: One STD Testing: Desires Full  Obstetrical History: G0P0000 -Desires Pregnancy Gynecological History: No history of abnormal paps.  H/O HSV and currently on suppression.  Vaginal/GU Concerns: She denies any abnormal vaginal discharge, bleeding, pelvic pain or other concerns. No pain or discomfort during sex.  Breast Concerns/Exams: No concerns. Endorses breast awareness and checks breast occasionally.   No family history of breast, uterine, cervical, or ovarian cancer  Medical and Nutrition PCP: None Significant PMx: None Exercise: None Tobacco/Drugs/Alcohol/Vaping: Occasional Wine Usage Nutrition:  Endorses balanced intake.   Social Safety at home: Endorses. Lives with SO DV/A: Denies Social Support: Endorses Employment: Air cabin crew x 3 years  Past Medical History:  Diagnosis Date   HSV (herpes simplex virus) infection     Past Surgical History:  Procedure Laterality Date   WISDOM TOOTH EXTRACTION      The following portions of the patient's history were reviewed and updated as appropriate: allergies, current medications, past family history, past medical history, past social history, past surgical history and problem list.   Health Maintenance: Pap: March 2022, Normal Results.  Mammogram: N/A.  Colonoscopy: N/A Review of Systems:  Pertinent items noted in HPI and remainder of comprehensive ROS otherwise negative.    Objective:    Physical Exam BP 121/80   Pulse 61   Ht 5\' 3"  (1.6 m)    Wt 214 lb 12.8 oz (97.4 kg)   LMP 12/28/2023 (Exact Date)   BMI 38.05 kg/m  Physical Exam Vitals reviewed. Exam conducted with a chaperone present Jyl Or, RN).  Constitutional:      Appearance: Normal appearance.  HENT:     Head: Normocephalic and atraumatic.  Eyes:     Conjunctiva/sclera: Conjunctivae normal.  Cardiovascular:     Rate and Rhythm: Normal rate and regular rhythm.     Heart sounds: Normal heart sounds.  Pulmonary:     Effort: Pulmonary effort is normal. No respiratory distress.     Breath sounds: Normal breath sounds.  Chest:  Breasts:    Right: No mass, nipple discharge, skin change or tenderness.     Left: No mass, nipple discharge, skin change or tenderness.     Comments: Nipples pierced bilaterally. Well healed. CBE completed Abdominal:     General: Bowel sounds are normal.     Palpations: Abdomen is soft.     Tenderness: There is no abdominal tenderness.  Genitourinary:    General: Normal vulva.     Labia:        Right: No tenderness.        Left: No tenderness or lesion.      Vagina: No vaginal discharge or bleeding.     Cervix: Friability (After pap collection) present.     Uterus: Not tender.      Comments: Pap collected with broom and spatula.  CV collected from posterior fornix. BME completed with no apparent enlargement or tenderness. Adnexa not appreciated. Exam limited by panus.   Musculoskeletal:  General: Normal range of motion.     Cervical back: Normal range of motion.  Skin:    General: Skin is warm and dry.  Neurological:     Mental Status: She is alert and oriented to person, place, and time.  Psychiatric:        Mood and Affect: Mood normal.        Behavior: Behavior normal.      Labs and Imaging No results found for this or any previous visit (from the past week). No results found.   Assessment & Plan:   28 year old Female Well Woman Exam Pap Smear STD Testing Breast Exam Desires Pregnancy    1. Well  woman exam with routine gynecological exam (Primary) -Exam performed and findings discussed. -Educated on ASCCP guidelines regarding pap smear evaluation and frequency. -Informed of turnover time and provider/clinic policy on releasing results. -Encouraged to activate and utilize Mychart for reviewing of results, communication with office, and scheduling of appts. -Educated on AHA exercise recommendations of 30 minutes of moderate to vigorous activity at least 5x/week.  - Cytology - PAP( [place]) - Cervicovaginal ancillary only( [place]) - HepB+HepC+HIV Panel  2. Pap smear for cervical cancer screening -Pap collected today. -No history of abnormal.  - Cytology - PAP( [place])  3. Encounter for screening examination for sexually transmitted disease -Desires full testing. -Will treat accordingly - Cervicovaginal ancillary only( [place]) - HepB+HepC+HIV Panel  4. Encounter for screening breast examination -CBE completed -Educated and encouraged to continue SBE with increased breast awareness including examination of breast for skin changes, moles, tenderness, etc.   5. Encounter for preconception consultation -Reviewed desires for pregnancy. -Educated on benefits of PNV prior to conception. Rx sent. -Discussed pregnancy with HSV history.  Instructed to continue suppression therapy. -Instructed to take home UPT at first missed menses and if positive make office appt. -Support and Well wishes given!  Routine preventative health maintenance measures emphasized. Please refer to After Visit Summary for other counseling recommendations.   No follow-ups on file.      Kraig Peru, CNM 01/22/2024

## 2024-01-22 NOTE — Progress Notes (Signed)
 Declines birth control. Wanting to conceive. 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    No other concerns at this time.

## 2024-01-23 LAB — CERVICOVAGINAL ANCILLARY ONLY
Chlamydia: NEGATIVE
Comment: NEGATIVE
Comment: NEGATIVE
Comment: NORMAL
Neisseria Gonorrhea: NEGATIVE
Trichomonas: NEGATIVE

## 2024-01-24 ENCOUNTER — Ambulatory Visit: Payer: Self-pay

## 2024-01-24 LAB — HEPB+HEPC+HIV PANEL
HIV Screen 4th Generation wRfx: NONREACTIVE
Hep B C IgM: NEGATIVE
Hep B Core Total Ab: NEGATIVE
Hep B E Ab: NONREACTIVE
Hep B E Ag: NEGATIVE
Hep B Surface Ab, Qual: REACTIVE
Hep C Virus Ab: NONREACTIVE
Hepatitis B Surface Ag: NEGATIVE

## 2024-01-28 LAB — CYTOLOGY - PAP
Comment: NEGATIVE
Comment: NEGATIVE
Comment: NEGATIVE
Diagnosis: NEGATIVE
HPV 16: NEGATIVE
HPV 18 / 45: NEGATIVE
High risk HPV: POSITIVE — AB

## 2024-01-30 DIAGNOSIS — R8789 Other abnormal findings in specimens from female genital organs: Secondary | ICD-10-CM | POA: Insufficient documentation

## 2024-02-04 ENCOUNTER — Other Ambulatory Visit: Payer: Self-pay

## 2024-02-04 ENCOUNTER — Ambulatory Visit
Admission: RE | Admit: 2024-02-04 | Discharge: 2024-02-04 | Disposition: A | Discharge status: Home / Self Care | Source: Ambulatory Visit | Attending: Physician Assistant | Admitting: Physician Assistant

## 2024-02-04 VITALS — BP 139/84 | HR 96 | Temp 97.9°F | Resp 18

## 2024-02-04 DIAGNOSIS — Z3201 Encounter for pregnancy test, result positive: Secondary | ICD-10-CM

## 2024-02-04 DIAGNOSIS — N926 Irregular menstruation, unspecified: Secondary | ICD-10-CM

## 2024-02-04 LAB — POCT URINE PREGNANCY: Preg Test, Ur: POSITIVE — AB

## 2024-02-04 NOTE — ED Provider Notes (Signed)
 [facility]    CSN: [id number] Arrival date & time: 02/04/24  0904      History   Chief Complaint Chief Complaint  Patient presents with   Possible Pregnancy    I took a at home pregnancy test and Iam pregnant - Entered by patient    HPI Yolanda Payne is a 28 y.o. female.   Patient presents today requesting pregnancy test.  She reports that her LMP was 12/24/2023.  She does not take any contraception.  She has never been pregnant before.  She is having some breast tenderness and nausea but denies additional symptoms including vomiting, abnormal bleeding, urinary symptoms, pelvic pain, abdominal pain.  She has been taking prenatal vitamins and will continue to do so.  Denies use of alcohol, drugs, tobacco.  She does have an OB/GYN that she intends to establish care with but has not yet seen them.  The only other medication that she takes regularly is Valtrex  for HSV.    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 Authorizing Provider  Prenatal Vit-Fe Fumarate-FA (PREPLUS) 27-1 MG TABS Take 1 tablet by mouth daily. 01/22/24   Loetta Ringer, CNM  valACYclovir  (VALTREX ) 1000 MG tablet Take 1 tablet (1,000 mg total) by mouth daily. 01/22/24   Loetta Ringer, CNM    Family History Family History  Problem Relation Age of Onset   Healthy Mother    Healthy Father    Hypertension Father      Social History Social History   Tobacco Use   Smoking status: Never   Smokeless tobacco: Never  Vaping Use   Vaping status: Never Used  Substance Use Topics   Alcohol use: Not Currently   Drug use: No     Allergies   Patient has no known allergies.   Review of Systems Review of Systems  Constitutional:  Negative for activity change, appetite change, fatigue and fever.  Gastrointestinal:  Positive for nausea. Negative for abdominal pain, diarrhea and vomiting.  Genitourinary:  Positive for menstrual problem. Negative for dysuria, frequency, pelvic pain, urgency, vaginal bleeding, vaginal discharge and vaginal pain.     Physical Exam Triage Vital Signs ED Triage Vitals [02/04/24 0917]  Encounter Vitals Group     BP 139/84     Systolic BP Percentile      Diastolic BP Percentile      Pulse Rate 96     Resp 18     Temp 97.9 F (36.6 C)     Temp Source Oral     SpO2 98 %     Weight      Height  Head Circumference      Peak Flow      Pain Score 0     Pain Loc      Pain Education      Exclude from Growth Chart    No data found.  Updated Vital Signs BP 139/84 (BP Location: Left Arm)   Pulse 96   Temp 97.9 F (36.6 C) (Oral)   Resp 18   LMP 12/28/2023 (Exact Date)   SpO2 98%   Visual Acuity Right Eye Distance:   Left Eye Distance:   Bilateral Distance:    Right Eye Near:   Left Eye Near:    Bilateral Near:     Physical Exam Vitals reviewed.  Constitutional:      General: She is awake. She is not in acute distress.    Appearance: Normal appearance. She is well-developed. She is not ill-appearing.     Comments: Very pleasant female appears agitated no acute distress sitting comfortably in exam room  HENT:     Head: Normocephalic and atraumatic.  Cardiovascular:     Rate and Rhythm: Normal rate and regular rhythm.     Heart sounds: No murmur heard. Pulmonary:     Effort: Pulmonary effort is normal.     Breath sounds: Normal breath sounds.  No wheezing, rhonchi or rales.  Abdominal:     General: Bowel sounds are normal.     Palpations: Abdomen is soft.     Tenderness: There is no abdominal tenderness. There is no right CVA tenderness, left CVA tenderness, guarding or rebound.  Musculoskeletal:     Right lower leg: No edema.     Left lower leg: No edema.  Psychiatric:        Behavior: Behavior is cooperative.      UC Treatments / Results  Labs (all labs ordered are listed, but only abnormal results are displayed) Labs Reviewed  POCT URINE PREGNANCY - Abnormal; Notable for the following components:      Result Value   Preg Test, Ur Positive (*)    All other components within normal limits    EKG   Radiology No results found.  Procedures Procedures (including critical care time)  Medications Ordered in UC Medications - No data to display  Initial Impression / Assessment and Plan / UC Course  I have reviewed the triage vital signs and the nursing notes.  Pertinent labs & imaging results that were available during my care of the patient were reviewed by me and considered in my medical decision making (see chart for details).     Patient is well-appearing, afebrile, nontoxic, nontachycardic.  Urine pregnancy was positive in clinic today.  She is already taking prenatal vitamins regularly and was encouraged to continue doing so.  We discussed that she should not use over-the-counter medications with the exception of Tylenol .  We discussed that all of her food should be cooked appropriately and she should avoid any raw foods including uncooked deli meat.  She was given the contact information for OB/GYN and encouraged to call to schedule an appointment.  We discussed that if anything changes and she has abdominal pain, pelvic pain, abnormal bleeding, urinary symptoms, nausea/vomiting interfering with oral intake, weakness she needs to be seen emergently.  Strict return precautions given.  Excuse note provided for  today.  Final Clinical Impressions(s) / UC Diagnoses   Final diagnoses:  Positive pregnancy test  Missed menses     Discharge Instructions      You had  a positive pregnancy test in clinic today.  Please continue prenatal vitamins daily.  You should abstain from alcohol, drug use, tobacco.  Many over-the-counter medications can be harmful so please avoid most of these with the exception of Tylenol .  You should avoid anything that raises your core body temperature including a sauna as in hot tubs.  Make sure that all of the food you are eating is well cooked and avoid anything that is real including uncooked lunch meat.  You will need to establish with an OB/GYN; call to schedule an appointment.  If you have any abdominal pain, nausea/vomiting interfering with oral intake, pelvic pain, bleeding you should be evaluated immediately.   ED Prescriptions   None    PDMP not reviewed this encounter.   Budd Cargo, PA-C 02/04/24 0940

## 2024-02-04 NOTE — ED Triage Notes (Signed)
 Pt here for possible pregnancy; pt sts multiple positive home tests and LMP was 12/28/23

## 2024-02-04 NOTE — Discharge Instructions (Addendum)
 You had a positive pregnancy test in clinic today.  Please continue prenatal vitamins daily.  You should abstain from alcohol, drug use, tobacco.  Many over-the-counter medications can be harmful so please avoid most of these with the exception of Tylenol .  You should avoid anything that raises your core body temperature including a sauna as in hot tubs.  Make sure that all of the food you are eating is well cooked and avoid anything that is real including uncooked lunch meat.  You will need to establish with an OB/GYN; call to schedule an appointment.  If you have any abdominal pain, nausea/vomiting interfering with oral intake, pelvic pain, bleeding you should be evaluated immediately.

## 2024-02-11 ENCOUNTER — Ambulatory Visit
Admission: RE | Admit: 2024-02-11 | Discharge: 2024-02-11 | Disposition: A | Discharge status: Home / Self Care | Source: Ambulatory Visit | Attending: Internal Medicine | Admitting: Internal Medicine

## 2024-02-11 VITALS — BP 114/66 | HR 68 | Temp 98.3°F | Resp 18 | Ht 63.0 in | Wt 214.8 lb

## 2024-02-11 DIAGNOSIS — N760 Acute vaginitis: Secondary | ICD-10-CM | POA: Diagnosis not present

## 2024-02-11 DIAGNOSIS — Z3A01 Less than 8 weeks gestation of pregnancy: Secondary | ICD-10-CM | POA: Insufficient documentation

## 2024-02-11 MED ORDER — TERCONAZOLE 0.4 % VA CREA: 1.0000 | TOPICAL_CREAM | Freq: Every day | VAGINAL | 0 refills | Status: AC

## 2024-02-11 NOTE — ED Triage Notes (Signed)
 I may have a yeast infection - Entered by patient

## 2024-02-11 NOTE — Discharge Instructions (Addendum)
I suspect that your  symptoms are due to suspected yeast infection.  Take medication as prescribed.  Your swab has been sent for testing, staff will call you in 2-3 days if you test positive for any other infections and will provide treatment at that time.  Return to urgent care as needed and follow-up with your primary care provider for further evaluation and management of your symptoms..  I hope you feel better!

## 2024-02-11 NOTE — ED Provider Notes (Signed)
 [facility]    CSN: [id number] Arrival date & time: 02/11/24  0853      History   Chief Complaint Chief Complaint  Patient presents with   Vaginal Itching    HPI Yolanda Payne is a 27 y.o. female.   Yolanda Payne is a 28 y.o. female presenting for chief complaint of Vaginal Itching that started a few days ago. Itching worsens after showering. No vaginal discharge or vaginal odor. Denies urinary symptoms, flank pain, dizziness, vaginal bleeding, abdominal pain, low back pain, and rash. Recently sexually active with new partner, had STD testing a few weeks ago and "everything was negative".  Denies recent antibiotic or steroid use.  She was seen in urgent care a few days ago due to missed menses and was found to have a positive pregnancy test.  Last menstrual cycle December 28, 2023.  Denies vaginal rash.  She has not attempted use of any over-the-counter medications to help with vaginal itching/irritation prior to arrival.   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 Authorizing Provider  Prenatal Vit-Fe Fumarate-FA (PREPLUS) 27-1 MG TABS Take 1 tablet by mouth daily. 01/22/24  Yes Loetta Ringer, CNM  terconazole (TERAZOL 7) 0.4 % vaginal cream Place 1 applicator vaginally at bedtime for 7 days. 02/11/24 02/18/24 Yes StanhopeDanny Dye, FNP  valACYclovir   (VALTREX ) 1000 MG tablet Take 1 tablet (1,000 mg total) by mouth daily. 01/22/24  Yes Loetta Ringer, CNM    Family History Family History  Problem Relation Age of Onset   Healthy Mother    Healthy Father    Hypertension Father     Social History Social History   Tobacco Use   Smoking status: Never   Smokeless tobacco: Never  Vaping Use   Vaping status: Never Used  Substance Use Topics   Alcohol use: Not Currently   Drug use: No     Allergies   Patient has no known allergies.   Review of Systems Review of Systems Per HPI  Physical Exam Triage Vital Signs ED Triage Vitals  Encounter Vitals Group     BP 02/11/24 0939 114/66     Systolic BP Percentile --      Diastolic BP Percentile --      Pulse Rate 02/11/24 0939 68     Resp 02/11/24 0939 18     Temp 02/11/24 0939 98.3 F (36.8 C)     Temp Source 02/11/24 0939 Oral     SpO2 02/11/24 0939 98 %     Weight 02/11/24 0936 214 lb 12.8 oz (97.4 kg)  Height 02/11/24 0936 5\' 3"  (1.6 m)     Head Circumference --      Peak Flow --      Pain Score 02/11/24 0935 0     Pain Loc --      Pain Education --      Exclude from Growth Chart --    No data found.  Updated Vital Signs BP 114/66 (BP Location: Left Arm)   Pulse 68   Temp 98.3 F (36.8 C) (Oral)   Resp 18   Ht 5\' 3"  (1.6 m)   Wt 214 lb 12.8 oz (97.4 kg)   LMP 12/28/2023 (Exact Date)   SpO2 98%   BMI 38.05 kg/m   Visual Acuity Right Eye Distance:   Left Eye Distance:   Bilateral Distance:    Right Eye Near:   Left Eye Near:    Bilateral Near:     Physical Exam Vitals and nursing note reviewed.  Constitutional:      Appearance: She is not ill-appearing or toxic-appearing.  HENT:     Head: Normocephalic and atraumatic.     Right Ear: Hearing and external ear normal.     Left Ear: Hearing and external ear normal.     Nose: Nose normal.     Mouth/Throat:     Lips: Pink.  Eyes:     General: Lids are normal. Vision grossly intact. Gaze aligned  appropriately.     Extraocular Movements: Extraocular movements intact.     Conjunctiva/sclera: Conjunctivae normal.  Pulmonary:     Effort: Pulmonary effort is normal.  Musculoskeletal:     Cervical back: Neck supple.  Skin:    General: Skin is warm and dry.     Capillary Refill: Capillary refill takes less than 2 seconds.     Findings: No rash.  Neurological:     General: No focal deficit present.     Mental Status: She is alert and oriented to person, place, and time. Mental status is at baseline.     Cranial Nerves: No dysarthria or facial asymmetry.  Psychiatric:        Mood and Affect: Mood normal.        Speech: Speech normal.        Behavior: Behavior normal.        Thought Content: Thought content normal.        Judgment: Judgment normal.      UC Treatments / Results  Labs (all labs ordered are listed, but only abnormal results are displayed) Labs Reviewed  CERVICOVAGINAL ANCILLARY ONLY    EKG   Radiology No results found.  Procedures Procedures (including critical care time)  Medications Ordered in UC Medications - No data to display  Initial Impression / Assessment and Plan / UC Course  I have reviewed the triage vital signs and the nursing notes.  Pertinent labs & imaging results that were available during my care of the patient were reviewed by me and considered in my medical decision making (see chart for details).   1.  Acute vaginitis, [redacted] weeks gestation of pregnancy High clinical suspicion for acute yeast vaginitis, therefore will treat with terconazole empirically. Vaginal swab pending, will treat for other positive infections per protocol when labs result. Discussed tips to prevent recurrent yeast vaginitis infections. Safe sex precautions discussed.   Counseled patient on potential for adverse effects with medications prescribed/recommended today, strict ER and return-to-clinic precautions discussed, patient verbalized understanding.     Final Clinical Impressions(s) / UC  Diagnoses   Final diagnoses:  Acute vaginitis  [redacted] weeks gestation of pregnancy     Discharge Instructions      I suspect that your  symptoms are due to suspected yeast infection.  Take medication as prescribed.  Your swab has been sent for testing, staff will call you in 2-3 days if you test positive for any other infections and will provide treatment at that time.  Return to urgent care as needed and follow-up with your primary care provider for further evaluation and management of your symptoms..  I hope you feel better!     ED Prescriptions     Medication Sig Dispense Auth. Provider   terconazole (TERAZOL 7) 0.4 % vaginal cream Place 1 applicator vaginally at bedtime for 7 days. 45 g Starlene Eaton, FNP      PDMP not reviewed this encounter.   Starlene Eaton, [place] 02/11/24 1023

## 2024-02-12 ENCOUNTER — Ambulatory Visit (HOSPITAL_COMMUNITY): Payer: Self-pay

## 2024-02-12 LAB — CERVICOVAGINAL ANCILLARY ONLY
Bacterial Vaginitis (gardnerella): NEGATIVE
Candida Glabrata: NEGATIVE
Candida Vaginitis: POSITIVE — AB
Comment: NEGATIVE
Comment: NEGATIVE
Comment: NEGATIVE

## 2024-02-15 ENCOUNTER — Inpatient Hospital Stay (HOSPITAL_COMMUNITY)
Admission: AD | Admit: 2024-02-15 | Discharge: 2024-02-15 | Disposition: A | Discharge status: Home / Self Care | Attending: Family Medicine | Admitting: Family Medicine

## 2024-02-15 ENCOUNTER — Inpatient Hospital Stay (HOSPITAL_COMMUNITY)

## 2024-02-15 ENCOUNTER — Other Ambulatory Visit: Payer: Self-pay

## 2024-02-15 DIAGNOSIS — O418X10 Other specified disorders of amniotic fluid and membranes, first trimester, not applicable or unspecified: Secondary | ICD-10-CM | POA: Diagnosis not present

## 2024-02-15 DIAGNOSIS — Z3A01 Less than 8 weeks gestation of pregnancy: Secondary | ICD-10-CM

## 2024-02-15 DIAGNOSIS — O468X1 Other antepartum hemorrhage, first trimester: Secondary | ICD-10-CM | POA: Insufficient documentation

## 2024-02-15 DIAGNOSIS — O98811 Other maternal infectious and parasitic diseases complicating pregnancy, first trimester: Secondary | ICD-10-CM | POA: Diagnosis not present

## 2024-02-15 DIAGNOSIS — B3731 Acute candidiasis of vulva and vagina: Secondary | ICD-10-CM | POA: Insufficient documentation

## 2024-02-15 DIAGNOSIS — O26851 Spotting complicating pregnancy, first trimester: Secondary | ICD-10-CM | POA: Diagnosis not present

## 2024-02-15 DIAGNOSIS — R102 Pelvic and perineal pain: Secondary | ICD-10-CM | POA: Diagnosis present

## 2024-02-15 DIAGNOSIS — O26859 Spotting complicating pregnancy, unspecified trimester: Secondary | ICD-10-CM

## 2024-02-15 DIAGNOSIS — O209 Hemorrhage in early pregnancy, unspecified: Secondary | ICD-10-CM | POA: Diagnosis present

## 2024-02-15 LAB — CBC
HCT: 38.4 % (ref 36.0–46.0)
Hemoglobin: 12.8 g/dL (ref 12.0–15.0)
MCH: 29.1 pg (ref 26.0–34.0)
MCHC: 33.3 g/dL (ref 30.0–36.0)
MCV: 87.3 fL (ref 80.0–100.0)
Platelets: 250 10*3/uL (ref 150–400)
RBC: 4.4 MIL/uL (ref 3.87–5.11)
RDW: 13.6 % (ref 11.5–15.5)
WBC: 10.2 10*3/uL (ref 4.0–10.5)
nRBC: 0 % (ref 0.0–0.2)

## 2024-02-15 LAB — ABO/RH: ABO/RH(D): O POS

## 2024-02-15 LAB — HCG, QUANTITATIVE, PREGNANCY: hCG, Beta Chain, Quant, S: 47605 m[IU]/mL — ABNORMAL HIGH (ref <5)

## 2024-02-15 NOTE — MAU Provider Note (Cosign Needed Addendum)
 History     CSN: [id number]  Arrival date and time: 02/15/24 1006   None     Chief Complaint  Patient presents with   Vaginal Bleeding   [name] is a 28 y.o. year old G41P0000 female at [redacted]w[redacted]d weeks gestation who presents to MAU reporting spotting and some mild cramps. First noticed a "tiny spot" after wiping this morning. She is currently taking Terazol for a yeast infection with vaginal applicator. She has been having mild abdominal cramps on and off since finding out that she's pregnant.     Vaginal Bleeding    OB History     Gravida  1   Para  0   Term  0   Preterm  0   AB  0   Living  0      SAB  0   IAB  0   Ectopic  0   Multiple  0   Live Births  0           Past Medical History:  Diagnosis Date   HSV (herpes simplex virus) infection     Past Surgical History:  Procedure Laterality Date   WISDOM TOOTH EXTRACTION      Family History  Problem Relation Age of Onset   Healthy Mother    Healthy Father    Hypertension Father     Social History   Tobacco Use   Smoking status: Never   Smokeless tobacco: Never  Vaping Use   Vaping status: Never Used  Substance Use Topics   Alcohol use: Not Currently   Drug use: No    Allergies: No Known Allergies  Medications Prior to Admission  Medication Sig Dispense Refill Last Dose/Taking   Prenatal Vit-Fe Fumarate-FA (PREPLUS) 27-1 MG TABS Take 1 tablet by mouth daily. 90 tablet 3 02/15/2024   terconazole  (TERAZOL 7 ) 0.4 % vaginal cream Place 1 applicator vaginally at bedtime for 7 days. 45 g 0 02/15/2024   valACYclovir  (VALTREX ) 1000 MG tablet Take 1 tablet (1,000 mg total) by mouth daily. 90 tablet 3 02/15/2024    Review of Systems  Genitourinary:  Positive for vaginal bleeding.  All other systems reviewed and are negative.  Physical Exam   Blood pressure 116/73, pulse 64, temperature 98 F (36.7 C), temperature source Oral, resp. rate 17, height 5\' 3"  (1.6 m), weight 101 kg,  last menstrual period 12/28/2023, SpO2 100%.  Physical Exam HENT:     Head: Normocephalic.  Cardiovascular:     Rate and Rhythm: Normal rate.  Pulmonary:     Effort: Pulmonary effort is normal.  Abdominal:     Palpations: Abdomen is soft.  Neurological:     Mental Status: She is alert and oriented to person, place, and time.  Psychiatric:        Mood and Affect: Mood normal.     MAU Course    MDM  Results for orders placed or performed during the hospital encounter of 02/15/24 (from the past 24 hours)  CBC     Status: None   Collection Time: 02/15/24 10:59 AM  Result Value Ref Range   WBC 10.2 4.0 - 10.5 K/uL   RBC 4.40 3.87 - 5.11 MIL/uL   Hemoglobin 12.8 12.0 - 15.0 g/dL   HCT 40.1 02.7 - 25.3 %   MCV 87.3 80.0 - 100.0 fL   MCH 29.1 26.0 - 34.0 pg   MCHC 33.3 30.0 - 36.0 g/dL   RDW 66.4 40.3 - 47.4 %  Platelets 250 150 - 400 K/uL   nRBC 0.0 0.0 - 0.2 %  hCG, quantitative, pregnancy     Status: Abnormal   Collection Time: 02/15/24 10:59 AM  Result Value Ref Range   hCG, Beta Chain, Quant, S 47,605 (H) <5 mIU/mL  ABO/Rh     Status: None   Collection Time: 02/15/24 10:59 AM  Result Value Ref Range   ABO/RH(D) O POS    No rh immune globuloin      NOT A RH IMMUNE GLOBULIN CANDIDATE, PT RH POSITIVE Performed at [facility], [address] [place], [place] [address]   US  OB LESS THAN 14 WEEKS WITH [place] TRANSVAGINAL Result Date: 02/15/2024 CLINICAL DATA:  First trimester pregnancy. Vaginal bleeding. LMP 12/28/2023. Beta HCG levels pending. EXAM: OBSTETRIC <14 WK US  AND TRANSVAGINAL OB US  TECHNIQUE: Both transabdominal and transvaginal ultrasound examinations were performed for complete evaluation of the gestation as well as the maternal uterus, adnexal regions, and pelvic cul-de-sac. Transvaginal technique was performed to assess early pregnancy. COMPARISON:  None Available. FINDINGS: Intrauterine gestational sac: Visualized/normal in shape. Yolk sac:  Visualized,  2.3 mm in diameter. Embryo:  Visualized. Cardiac Activity: Visualized. Heart Rate: 138 bpm CRL:  8.0 mm; 6 w 5 d;                  US  EDC: 10/05/2024 Subchorionic hemorrhage: Small subchorionic hematoma measuring approximately 6.5 x 7.0 x 6.8 mm. Maternal uterus/adnexae: Both maternal ovaries are visualized. Corpus luteal cyst on the right. No suspicious adnexal findings. Trace free pelvic fluid, within physiologic limits. IMPRESSION: 1. Single live intrauterine pregnancy with best estimated gestational age of [redacted] weeks 5 days. 2. Small subchronic hematoma. 3. No suspicious adnexal findings. Electronically Signed   By: Elmon Hagedorn M.D.   On: 02/15/2024 12:19     Assessment and Plan  1. Spotting in early pregnancy (Primary) 2. [redacted] weeks gestation of pregnancy 3. Subchorionic hematoma in first trimester, single or unspecified fetus  - Reviewed the results of the OB US  with patient. A small subchorionic hemorrhage is present and may cause further vaginal bleeding. It's typically self limiting.  - Return precautions for bleeding soaking a pad in 1 hr, increased abdominal cramping, or any other pregnancy related emergencies. - Patient should keep her new OB appointment that is scheduled with [facility].       Wilford Hanks 02/15/2024, 10:57 AM   Midwife Attestation:  I personally saw and evaluated the patient, performing the key elements of the service. I developed and verified the management plan that is described in the resident's/student's note, and I agree with the content with my edits above. VSS, HRR&R, Resp unlabored, Legs neg.    Arlester Bence, CNM 6:00 PM

## 2024-02-15 NOTE — MAU Note (Addendum)
 Yolanda Payne is a 28 y.o. at [redacted]w[redacted]d here in MAU reporting: pt at work and went to bathroom and noticed vaginal bleeding on her panty liner. States it was a single small spot that was light pink. Denies any recent sexual intercourse. Pt currently has yeast infection and is currently using a vaginal suppository cream. Has been using the cream for 4 days. Denies any itching or odor.  First OB appointment June 18th at [facility]   LMP: 12/28/23 Onset of complaint: 0900 Pain score: 0 Vitals:   02/15/24 1034  BP: 116/73  Pulse: 64  Resp: 17  Temp: 98 F (36.7 C)  SpO2: 100%     FHT:n/a Lab orders placed from triage:  UA

## 2024-02-17 LAB — POCT PREGNANCY, URINE: Preg Test, Ur: POSITIVE — AB

## 2024-02-26 ENCOUNTER — Other Ambulatory Visit (INDEPENDENT_AMBULATORY_CARE_PROVIDER_SITE_OTHER): Payer: Self-pay

## 2024-02-26 ENCOUNTER — Ambulatory Visit: Admitting: *Deleted

## 2024-02-26 VITALS — BP 116/78 | HR 78 | Wt 219.8 lb

## 2024-02-26 DIAGNOSIS — Z3401 Encounter for supervision of normal first pregnancy, first trimester: Secondary | ICD-10-CM | POA: Diagnosis not present

## 2024-02-26 DIAGNOSIS — Z3A08 8 weeks gestation of pregnancy: Secondary | ICD-10-CM

## 2024-02-26 DIAGNOSIS — Z3480 Encounter for supervision of other normal pregnancy, unspecified trimester: Secondary | ICD-10-CM | POA: Insufficient documentation

## 2024-02-26 DIAGNOSIS — Z362 Encounter for other antenatal screening follow-up: Secondary | ICD-10-CM

## 2024-02-26 DIAGNOSIS — Z1331 Encounter for screening for depression: Secondary | ICD-10-CM | POA: Diagnosis not present

## 2024-02-26 MED ORDER — BLOOD PRESSURE KIT DEVI: 1.0000 | 0 refills | Status: DC

## 2024-02-26 NOTE — Progress Notes (Signed)
 New OB Intake  I connected with Yolanda Payne  on 02/26/24 at  1:10 PM EDT by In Person Visit and verified that I am speaking with the correct person using two identifiers. Nurse is located at [facility] and pt is located at [place].  I discussed the limitations, risks, security and privacy concerns of performing an evaluation and management service by telephone and the availability of in person appointments. I also discussed with the patient that there may be a patient responsible charge related to this service. The patient expressed understanding and agreed to proceed.  I explained I am completing New OB Intake today. We discussed EDD of 10/03/2024, by Last Menstrual Period. Pt is G1P0000. I reviewed her allergies, medications and Medical/Surgical/OB history.    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 Discussed the nature of our practice with multiple providers including residents and students. Due to the size of the practice, the delivering provider may not be the same as those providing prenatal care.   Patient is not interested in water birth.  MyChart/Babyscripts MyChart access verified. I explained pt will have some visits in office and some virtually. Babyscripts instructions given and order placed. Patient verifies receipt of registration text/e-mail. Account successfully created and app downloaded. If patient is a candidate for Optimized scheduling, add to sticky note.   Blood Pressure Cuff/Weight Scale Blood pressure cuff ordered for patient to pick-up from [employer]. Explained after first prenatal appt pt will check weekly and document in Babyscripts. Patient does not have weight scale; patient may purchase if they desire to track weight weekly in  Babyscripts.  Anatomy US  Explained first scheduled US  will be around 19 weeks. Anatomy US  scheduled for TBD at [facility]BD.  Is patient a candidate for Babyscripts Optimization? No, due to High Risk   First visit review I reviewed new OB appt with patient. Explained pt will be seen by Dr. Dodie Frees at first visit. Discussed [facility] [place] genetic screening with patient. Requests Pan[facility] and [facility].. Routine prenatal labs OB Urine only collected at today's visit. Initial OB labs deferred to New OB.   Last Pap Diagnosis  Date Value Ref Range Status  01/22/2024   Final   - Negative for intraepithelial lesion or malignancy (NILM)    Donette Furlong, RN 02/26/2024  11:48 AM

## 2024-02-26 NOTE — Patient Instructions (Signed)

## 2024-02-28 ENCOUNTER — Ambulatory Visit: Payer: Self-pay | Admitting: Obstetrics and Gynecology

## 2024-02-28 DIAGNOSIS — B9689 Other specified bacterial agents as the cause of diseases classified elsewhere: Secondary | ICD-10-CM

## 2024-02-28 LAB — URINE CULTURE, OB REFLEX

## 2024-02-28 LAB — CULTURE, OB URINE

## 2024-02-28 MED ORDER — METRONIDAZOLE 500 MG PO TABS: 500.0000 mg | ORAL_TABLET | Freq: Two times a day (BID) | ORAL | 0 refills | Status: DC

## 2024-03-02 ENCOUNTER — Other Ambulatory Visit: Payer: Self-pay

## 2024-03-02 DIAGNOSIS — B9689 Other specified bacterial agents as the cause of diseases classified elsewhere: Secondary | ICD-10-CM

## 2024-03-02 DIAGNOSIS — B379 Candidiasis, unspecified: Secondary | ICD-10-CM

## 2024-03-02 MED ORDER — TERCONAZOLE 0.8 % VA CREA: 1.0000 | TOPICAL_CREAM | Freq: Every day | VAGINAL | 0 refills | Status: DC

## 2024-03-18 MED ORDER — METRONIDAZOLE 500 MG PO TABS: 500.0000 mg | ORAL_TABLET | Freq: Two times a day (BID) | ORAL | 0 refills | Status: DC

## 2024-03-19 ENCOUNTER — Other Ambulatory Visit: Payer: Self-pay

## 2024-03-19 MED ORDER — METRONIDAZOLE 0.75 % VA GEL: 1.0000 | Freq: Every day | VAGINAL | 0 refills | Status: DC

## 2024-03-23 ENCOUNTER — Other Ambulatory Visit: Payer: Self-pay

## 2024-03-23 DIAGNOSIS — B379 Candidiasis, unspecified: Secondary | ICD-10-CM

## 2024-03-23 MED ORDER — TERCONAZOLE 0.8 % VA CREA: 1.0000 | TOPICAL_CREAM | Freq: Every day | VAGINAL | 0 refills | Status: DC

## 2024-03-25 ENCOUNTER — Other Ambulatory Visit: Payer: Self-pay

## 2024-03-25 ENCOUNTER — Inpatient Hospital Stay (HOSPITAL_COMMUNITY)
Admission: AD | Admit: 2024-03-25 | Discharge: 2024-03-25 | Disposition: A | Discharge status: Home / Self Care | Attending: Obstetrics and Gynecology | Admitting: Obstetrics and Gynecology

## 2024-03-25 ENCOUNTER — Encounter (HOSPITAL_COMMUNITY): Payer: Self-pay | Admitting: Obstetrics and Gynecology

## 2024-03-25 DIAGNOSIS — O9A211 Injury, poisoning and certain other consequences of external causes complicating pregnancy, first trimester: Secondary | ICD-10-CM | POA: Diagnosis present

## 2024-03-25 DIAGNOSIS — R1010 Upper abdominal pain, unspecified: Secondary | ICD-10-CM | POA: Insufficient documentation

## 2024-03-25 DIAGNOSIS — G44209 Tension-type headache, unspecified, not intractable: Secondary | ICD-10-CM | POA: Insufficient documentation

## 2024-03-25 DIAGNOSIS — S3991XA Unspecified injury of abdomen, initial encounter: Secondary | ICD-10-CM | POA: Insufficient documentation

## 2024-03-25 DIAGNOSIS — Z3A12 12 weeks gestation of pregnancy: Secondary | ICD-10-CM | POA: Diagnosis not present

## 2024-03-25 DIAGNOSIS — R519 Other specified pregnancy related conditions, first trimester: Secondary | ICD-10-CM

## 2024-03-25 MED ORDER — CYCLOBENZAPRINE HCL 5 MG PO TABS
10.0000 mg | ORAL_TABLET | Freq: Once | ORAL | Status: AC
  Administered 2024-03-25: 10 mg via ORAL
  Filled 2024-03-25: qty 2

## 2024-03-25 MED ORDER — ACETAMINOPHEN-CAFFEINE 500-65 MG PO TABS
2.0000 | ORAL_TABLET | Freq: Once | ORAL | Status: AC
  Administered 2024-03-25: 2 via ORAL
  Filled 2024-03-25: qty 2

## 2024-03-25 MED ORDER — CYCLOBENZAPRINE HCL 10 MG PO TABS: 10.0000 mg | ORAL_TABLET | Freq: Two times a day (BID) | ORAL | 0 refills | Status: DC | PRN
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
## 2024-03-25 NOTE — Discharge Instructions (Signed)
    [facility]     [facility]                    [facility]                 [facility]    **Purchase ONE of these store-brand Excedrin Tension Headache medications. Take it as directed for relief of headache. DO NOT TAKE AT THE SAME TIME AS TYLENOL /ACETAMINOPHEN **

## 2024-03-30 ENCOUNTER — Encounter: Payer: Self-pay | Admitting: Obstetrics and Gynecology

## 2024-03-30 ENCOUNTER — Other Ambulatory Visit (HOSPITAL_COMMUNITY)
Admission: RE | Admit: 2024-03-30 | Discharge: 2024-03-30 | Disposition: A | Discharge status: Home / Self Care | Source: Ambulatory Visit | Attending: Obstetrics and Gynecology | Admitting: Obstetrics and Gynecology

## 2024-03-30 ENCOUNTER — Ambulatory Visit: Admitting: Obstetrics and Gynecology

## 2024-03-30 VITALS — BP 102/69 | HR 75 | Wt 219.0 lb

## 2024-03-30 DIAGNOSIS — Z1331 Encounter for screening for depression: Secondary | ICD-10-CM | POA: Diagnosis not present

## 2024-03-30 DIAGNOSIS — Z131 Encounter for screening for diabetes mellitus: Secondary | ICD-10-CM

## 2024-03-30 DIAGNOSIS — Z113 Encounter for screening for infections with a predominantly sexual mode of transmission: Secondary | ICD-10-CM | POA: Diagnosis not present

## 2024-03-30 DIAGNOSIS — Z3A13 13 weeks gestation of pregnancy: Secondary | ICD-10-CM

## 2024-03-30 DIAGNOSIS — O99212 Obesity complicating pregnancy, second trimester: Secondary | ICD-10-CM | POA: Insufficient documentation

## 2024-03-30 DIAGNOSIS — Z3480 Encounter for supervision of other normal pregnancy, unspecified trimester: Secondary | ICD-10-CM | POA: Insufficient documentation

## 2024-03-30 DIAGNOSIS — O99210 Obesity complicating pregnancy, unspecified trimester: Secondary | ICD-10-CM | POA: Insufficient documentation

## 2024-03-30 DIAGNOSIS — O99211 Obesity complicating pregnancy, first trimester: Secondary | ICD-10-CM | POA: Diagnosis not present

## 2024-03-30 MED ORDER — ASPIRIN 81 MG PO TBEC: 81.0000 mg | DELAYED_RELEASE_TABLET | Freq: Every day | ORAL | 2 refills | Status: DC

## 2024-03-30 NOTE — Progress Notes (Signed)
 NOB, c/o recurrent BV.  Self swab done.

## 2024-03-30 NOTE — Progress Notes (Signed)
  Subjective:    Yolanda Payne is a G1P0000 [redacted]w[redacted]d being seen today for her first obstetrical visit.  Her obstetrical history is significant for obesity. Patient does intend to breast feed. Pregnancy history fully reviewed.  Patient reports no complaints.  Vitals:   03/30/24 0924  BP: 102/69  Pulse: 75  Weight: 219 lb (99.3 kg)    HISTORY: OB History  Gravida Para Term Preterm AB Living  1 0 0 0 0 0  SAB IAB Ectopic Multiple Live Births  0 0 0 0 0    # Outcome Date GA Lbr Len/2nd Weight Sex Type Anes PTL Lv  1 Current            Past Medical History:  Diagnosis Date   HSV (herpes simplex virus) infection    Past Surgical History:  Procedure Laterality Date   WISDOM TOOTH EXTRACTION     Family History  Problem Relation Age of Onset   Healthy Mother    Healthy Father    Hypertension Father      Exam    Uterus:   13-weeks  Pelvic Exam:    Perineum: Normal Perineum   Vulva: normal   Vagina:  normal mucosa, normal discharge   pH:    Cervix: nulliparous appearance and closed and long   Adnexa: no mass, fullness, tenderness   Bony Pelvis: gynecoid  System: Breast:  normal appearance, no masses or tenderness   Skin: normal coloration and turgor, no rashes    Neurologic: oriented, no focal deficits   Extremities: normal strength, tone, and muscle mass   HEENT extra ocular movement intact   Mouth/Teeth mucous membranes moist, pharynx normal without lesions and dental hygiene good   Neck supple and no masses   Cardiovascular: regular rate and rhythm   Respiratory:  appears well, vitals normal, no respiratory distress, acyanotic, normal RR, chest clear, no wheezing, crepitations, rhonchi, normal symmetric air entry   Abdomen: soft, non-tender; bowel sounds normal; no masses,  no organomegaly   Urinary:       Assessment:    Pregnancy: G1P0000 Patient Active Problem List   Diagnosis Date Noted   Maternal obesity affecting pregnancy, antepartum 03/30/2024    Supervision of other normal pregnancy, antepartum 02/26/2024   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 Prenatal vitamins. Problem list reviewed and updated. Genetic Screening discussed : ordered.  Ultrasound discussed; fetal survey: ordered. Rx ASA provided  Follow up in 4 weeks. 50% of 30 min visit spent on counseling and coordination of care.     Yolanda Payne 03/30/2024

## 2024-03-30 NOTE — Patient Instructions (Signed)
 Second Trimester of Pregnancy  The second trimester of pregnancy is from week 14 through week 27. This is months 4 through 6 of pregnancy. During the second trimester: Morning sickness is less or has stopped. You may have more energy. You may feel hungry more often. At this time, your unborn baby is growing very fast. At the end of the sixth month, the unborn baby may be up to 12 inches long and weigh about 1 pounds. You will likely start to feel the baby move between 16 and 20 weeks of pregnancy. Body changes during your second trimester Your body continues to change during this time. The changes usually go away after your baby is born. Physical changes You will gain more weight. Your belly will get bigger. You may begin to get stretch marks on your hips, belly, and breasts. Your breasts will keep growing and may hurt. You may get dark spots or blotches on your face. A dark line from your belly button to the pubic area may appear. This line is called linea nigra. Your hair may grow faster and get thicker. Health changes You may have headaches. You may have heartburn. You may pee more often. You may have swollen, bulging veins (varicose veins). You may have trouble pooping (constipation), or swollen veins in the butt that can itch or get painful (hemorrhoids). You may have back pain. This is caused by: Weight gain. Pregnancy hormones that are relaxing the joints in your pelvis. Follow these instructions at home: Medicines Talk to your health care provider if you're taking medicines. Ask if the medicines are safe to take during pregnancy. Your provider may change the medicines that you take. Do not take any medicines unless told to by your provider. Take a prenatal vitamin that has at least 600 micrograms (mcg) of folic acid. Do not use herbal medicines, illegal drugs, or medicines that are not approved by your provider. Eating and drinking While you're pregnant your body needs  extra food for your growing baby. Talk with your provider about what to eat while pregnant. Activity Most women are able to exercise during pregnancy. Exercises may need to change as your pregnancy goes on. Talk to your provider about your activities and exercise routines. Relieving pain and discomfort Wear a good, supportive bra if your breasts hurt. Rest with your legs raised if you have leg cramps or low back pain. Take warm sitz baths to soothe pain from hemorrhoids. Use hemorrhoid cream if your provider says it's okay. Do not douche. Do not use tampons or scented pads. Do not use hot tubs, steam rooms, or saunas. Safety Wear your seatbelt at all times when you're in a car. Talk to your provider if someone hits you, hurts you, or yells at you. Talk with your provider if you're feeling sad or have thoughts of hurting yourself. Lifestyle Certain things can be harmful while you're pregnant. It's best to avoid the following: Do not drink alcohol,smoke, vape, or use products with nicotine or tobacco in them. If you need help quitting, talk with your provider. Avoid cat litter boxes and soil used by cats. These things carry germs that can cause harm to your pregnancy and your baby. General instructions Keep all follow-up visits. It helps you and your unborn baby stay as healthy as possible. Write down your questions. Take them to your prenatal visits. Your provider will: Talk with you about your overall health. Give you advice or refer you to specialists who can help with different needs,  including: Prenatal education classes. Mental health and counseling. Foods and healthy eating. Ask for help if you need help with food. Where to find more information American Pregnancy Association: americanpregnancy.org [employer]: acog.org Office on [employer] Health: [employer] Contact a health care provider if: You have a headache that does not go away  when you take medicine. You have any of these problems: You can't eat or drink. You throw up or feel like you may throw up. You have watery poop (diarrhea) for 2 days or more. You have pain when you pee or your pee smells bad. You have been sick for 2 days or more and are not getting better. Contact your provider right away if: You have any of these coming from your vagina: Abnormal discharge. Bad-smelling fluid. Bleeding. Your baby is moving less than usual. You have contractions, belly cramping, or have pain in your pelvis or lower back. You have symptoms of high blood pressure or preeclampsia. These include: A severe, throbbing headache that does not go away. Sudden or extreme swelling of your face, hands, legs, or feet. Vision problems: You see spots. You have blurry vision. Your eyes are sensitive to light. If you can't reach the provider, go to an urgent care or emergency room. Get help right away if: You faint, become confused, or can't think clearly. You have chest pain or trouble breathing. You have any kind of injury, such as from a fall or a car crash. These symptoms may be an emergency. Call 911 right away. Do not wait to see if the symptoms will go away. Do not drive yourself to the hospital. This information is not intended to replace advice given to you by your health care provider. Make sure you discuss any questions you have with your health care provider. Document Revised: 05/30/2023 Document Reviewed: 12/28/2022 Elsevier Patient Education  2024 [employer].

## 2024-03-31 LAB — HEMOGLOBIN A1C
Est. average glucose Bld gHb Est-mCnc: 111 mg/dL
Hgb A1c MFr Bld: 5.5 % (ref 4.8–5.6)

## 2024-03-31 LAB — CERVICOVAGINAL ANCILLARY ONLY
Bacterial Vaginitis (gardnerella): NEGATIVE
Candida Glabrata: NEGATIVE
Candida Vaginitis: NEGATIVE
Chlamydia: NEGATIVE
Comment: NEGATIVE
Comment: NEGATIVE
Comment: NEGATIVE
Comment: NEGATIVE
Comment: NEGATIVE
Comment: NORMAL
Neisseria Gonorrhea: NEGATIVE
Trichomonas: NEGATIVE

## 2024-03-31 LAB — CBC/D/PLT+RPR+RH+ABO+RUBIGG...
Antibody Screen: NEGATIVE
Basophils Absolute: 0 x10E3/uL (ref 0.0–0.2)
Basos: 0 %
EOS (ABSOLUTE): 0 x10E3/uL (ref 0.0–0.4)
Eos: 0 %
HCV Ab: NONREACTIVE
HIV Screen 4th Generation wRfx: NONREACTIVE
Hematocrit: 40.9 % (ref 34.0–46.6)
Hemoglobin: 13.3 g/dL (ref 11.1–15.9)
Hepatitis B Surface Ag: NEGATIVE
Immature Grans (Abs): 0 x10E3/uL (ref 0.0–0.1)
Immature Granulocytes: 0 %
Lymphocytes Absolute: 1.4 x10E3/uL (ref 0.7–3.1)
Lymphs: 18 %
MCH: 29.6 pg (ref 26.6–33.0)
MCHC: 32.5 g/dL (ref 31.5–35.7)
MCV: 91 fL (ref 79–97)
Monocytes Absolute: 0.5 x10E3/uL (ref 0.1–0.9)
Monocytes: 6 %
Neutrophils Absolute: 6.1 x10E3/uL (ref 1.4–7.0)
Neutrophils: 76 %
Platelets: 267 x10E3/uL (ref 150–450)
RBC: 4.49 x10E6/uL (ref 3.77–5.28)
RDW: 14.1 % (ref 11.7–15.4)
RPR Ser Ql: NONREACTIVE
Rh Factor: POSITIVE
Rubella Antibodies, IGG: 5.86 {index} (ref >0.99)
WBC: 8.1 x10E3/uL (ref 3.4–10.8)

## 2024-03-31 LAB — HCV INTERPRETATION

## 2024-04-02 ENCOUNTER — Ambulatory Visit: Payer: Self-pay | Admitting: Obstetrics and Gynecology

## 2024-04-02 DIAGNOSIS — Z3480 Encounter for supervision of other normal pregnancy, unspecified trimester: Secondary | ICD-10-CM

## 2024-04-04 LAB — PANORAMA PRENATAL TEST FULL PANEL:PANORAMA TEST PLUS 5 ADDITIONAL MICRODELETIONS: FETAL FRACTION: 5.6

## 2024-04-06 LAB — HORIZON CUSTOM: REPORT SUMMARY: NEGATIVE

## 2024-04-20 NOTE — Progress Notes (Signed)
 Patient complaining of recurrent BV. Vaginal swab collected for testing

## 2024-04-27 ENCOUNTER — Encounter: Payer: Self-pay | Admitting: Advanced Practice Midwife

## 2024-04-27 ENCOUNTER — Ambulatory Visit (INDEPENDENT_AMBULATORY_CARE_PROVIDER_SITE_OTHER): Admitting: Advanced Practice Midwife

## 2024-04-27 VITALS — BP 123/72 | HR 96 | Wt 220.2 lb

## 2024-04-27 DIAGNOSIS — B009 Herpesviral infection, unspecified: Secondary | ICD-10-CM | POA: Diagnosis not present

## 2024-04-27 DIAGNOSIS — Z3402 Encounter for supervision of normal first pregnancy, second trimester: Secondary | ICD-10-CM

## 2024-04-27 DIAGNOSIS — Z3A17 17 weeks gestation of pregnancy: Secondary | ICD-10-CM | POA: Diagnosis not present

## 2024-04-27 MED ORDER — VALACYCLOVIR HCL 1 G PO TABS: 1000.0000 mg | ORAL_TABLET | Freq: Every day | ORAL | 3 refills | Status: DC

## 2024-04-27 NOTE — Progress Notes (Signed)
 ROB. No concerns at this time.

## 2024-04-27 NOTE — Progress Notes (Addendum)
   PRENATAL VISIT NOTE  Subjective:  Yolanda Payne is a 28 y.o. G1P0000 at [redacted]w[redacted]d being seen today for ongoing prenatal care.  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  Patient reports no complaints.  Contractions: Not present. Vag. Bleeding: None.  Movement: Increased. Denies leaking of fluid.   The following portions of the patient's history were reviewed and updated as appropriate: allergies, current medications, past family history, past medical history, past social history, past surgical history and problem list.   Objective:    Vitals:   04/27/24 0935  BP: 123/72  Pulse: 96  Weight: 99.9 kg    Fetal Status:  Fetal Heart Rate (bpm): 147   Movement: Increased    General: Alert, oriented and cooperative. Patient is in no acute distress.  Skin: Skin is warm and dry. No rash noted.   Cardiovascular: Normal heart rate noted  Respiratory: Normal respiratory effort, no problems with respiration noted  Abdomen: Soft, gravid, appropriate for gestational age.    Pelvic: Cervical exam deferred        Extremities: Normal range of motion.  Edema: None  Mental Status: Normal mood and affect. Normal behavior. Normal judgment and thought content.   Assessment and Plan:  Pregnancy: G1P0000 at [redacted]w[redacted]d 1. Encounter for supervision of normal first pregnancy in second trimester (Primary) Feeling well today, has started to feel fetal movement Discussed safe foods in pregnancy  2. [redacted] weeks gestation of pregnancy - AFP, Serum, Open Spina Bifida  3. 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  Dispense: 90 tablet; Refill: 3   Preterm labor symptoms and general obstetric precautions including but not limited to vaginal bleeding, contractions, leaking of fluid and fetal movement were reviewed in detail with the patient. Please refer to After Visit Summary for other counseling recommendations.   No follow-ups on file.  Future Appointments  Date Time Provider Department Center  05/14/2024  9:00 AM [facility] PROVIDER 1 [facility]  05/14/2024  9:30 AM [facility] US2 [facility]  05/25/2024 10:15 AM Constant, Winton, MD [facility] None   Vernell FORBES Ruddle, Student-MidWife 04/27/24  Midwife Attestation:  I personally saw and evaluated the patient, performing the key elements of the service. I developed and verified the management plan that is described in the resident's/student's note, and I agree with the content with my edits above. VSS, HRR&R, Resp unlabored, Legs neg.    Olam Boards, CNM 2:19 PM

## 2024-04-29 LAB — AFP, SERUM, OPEN SPINA BIFIDA
AFP MoM: 0.95
AFP Value: 31.5 ng/mL
Gest. Age on Collection Date: 17 wk
Maternal Age At EDD: 30.6 a
OSBR Risk 1 IN: 10000
Test Results:: NEGATIVE
Weight: 220 [lb_av]

## 2024-05-14 ENCOUNTER — Ambulatory Visit

## 2024-05-14 ENCOUNTER — Other Ambulatory Visit: Payer: Self-pay | Admitting: *Deleted

## 2024-05-14 ENCOUNTER — Ambulatory Visit: Attending: Obstetrics and Gynecology | Admitting: Maternal & Fetal Medicine

## 2024-05-14 VITALS — BP 113/53 | HR 91

## 2024-05-14 DIAGNOSIS — Z363 Encounter for antenatal screening for malformations: Secondary | ICD-10-CM | POA: Insufficient documentation

## 2024-05-14 DIAGNOSIS — Z3480 Encounter for supervision of other normal pregnancy, unspecified trimester: Secondary | ICD-10-CM | POA: Diagnosis not present

## 2024-05-14 DIAGNOSIS — O99212 Obesity complicating pregnancy, second trimester: Secondary | ICD-10-CM | POA: Diagnosis not present

## 2024-05-14 DIAGNOSIS — Z3689 Encounter for other specified antenatal screening: Secondary | ICD-10-CM

## 2024-05-14 DIAGNOSIS — E669 Obesity, unspecified: Secondary | ICD-10-CM

## 2024-05-14 DIAGNOSIS — Z3A19 19 weeks gestation of pregnancy: Secondary | ICD-10-CM | POA: Diagnosis not present

## 2024-05-14 NOTE — Progress Notes (Signed)
 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 G1P0000 at [redacted]w[redacted]d here for ultrasound and consultation. She had Low risk aneuploidy screening of a female fetus. Carrier screening was Negative for the basic screening (SMA, alpha-thal, beta-thal, and cystic fibroisis. Maternal serum AFP was negative. She has no acute concerns.   Today we focused on the following:   I discussed with the patient there could be a possible small VSD.  I think it is unlikely and is likely reflective of artifact using color Doppler.  If this is seen on subsequent ultrasounds then we can discuss the utility of a fetal echo.  There are no signs of other more serious heart condition such as tetralogy of Fallot.  Elevated BMI: I discussed the potential complications associated with obesity in pregnancy.  These complications include but are not limited to increased risk of excessive maternal weight gain, fetal growth abnormalities, fetal congenital disorders, inability to visualize fetal anatomic structures on ultrasound, gestational diabetes, hypertensive disorders of pregnancy, operative birth including cesarean delivery or assisted vaginal delivery, delayed wound healing and many long-term health complications.  I discussed the need for continued growth ultrasounds and possibly antenatal testing depending upon how the pregnancy course progresses.  Maternal weight gain should be limited to 10 to 20 pounds during the pregnancy.  While normal weight loss may occur during the  first and early second trimester, efforts to actively lose weight with the use of medication is not recommended during pregnancy.  A whole food diet and regular exercise of at least 15 to 30 minutes of moderately strenuous activity is recommended in the absence of any contraindications. Weight loss with the use of medications is not recommended during pregnancy.  If other existing comorbidities are present then 81 mg of aspirin  should be considered for preeclampsia risk reduction.   Sonographic findings Single intrauterine pregnancy at 19w 5d  Fetal cardiac activity:  Observed and appears normal. Presentation: Cephalic. The anatomic structures that were well seen appear normal without evidence of soft markers. Due to poor acoustic windows some structures remain suboptimally visualized. Fetal biometry shows the estimated fetal weight at the 29 percentile.  Amniotic fluid: Within normal limits.  MVP: 4.31 cm. Placenta: Anterior. Adnexa: No adnexal mass visualized.  There are limitations of prenatal ultrasound such as the inability to detect certain abnormalities due to poor visualization. Various factors such as fetal position, gestational age and maternal body habitus may increase the difficulty in visualizing the fetal anatomy.    Recommendations - EDD should be 10/03/2024 based on  LMP  (12/28/23). - Follow up ultrasound in 4-6 weeks to attempt visualization of the anatomy not seen and reassess the fetal growth - Anatomy ultrasound was done today with the above findings (see report). - Aspirin  81-162 mg from 12 weeks and continued throughout the pregnancy for preeclampsia prophylaxis. - Baseline labs: CMP, CBC, urine protein creatinine ratio. - Blood pressure goal of < 140 systolic and < 90 diastolic. Antihypertensive medication should be added/adjusted until BP goal is achieved.   Review of Systems: A review of  systems was performed and was negative except per HPI   Past Obstetrical  History:  OB History  Gravida Para Term Preterm AB Living  1 0 0 0 0 0  SAB IAB Ectopic Multiple Live Births  0 0 0 0 0    # Outcome Date GA Lbr Len/2nd Weight Sex Type Anes PTL Lv  1 Current              Past Medical History:  Past Medical History:  Diagnosis Date   Bacterial vaginosis 02/18/2023   HSV (herpes simplex virus) infection      Past Surgical History:    Past Surgical History:  Procedure Laterality Date   WISDOM TOOTH EXTRACTION       Home Medications:   Current Outpatient Medications on File Prior to Visit  Medication Sig Dispense Refill   Prenatal Vit-Fe Fumarate-FA (PREPLUS) 27-1 MG TABS Take 1 tablet by mouth daily. 90 tablet 3   valACYclovir  (VALTREX ) 1000 MG tablet Take 1 tablet (1,000 mg total) by mouth daily. 90 tablet 3   aspirin  EC 81 MG tablet Take 1 tablet (81 mg total) by mouth daily. Take after 12 weeks for prevention of preeclampsia later in pregnancy (Patient not taking: Reported on 05/14/2024) 300 tablet 2   Blood Pressure Monitoring (BLOOD PRESSURE KIT) DEVI 1 Device by Does not apply route once a week. (Patient not taking: Reported on 04/27/2024) 1 each 0   cyclobenzaprine  (FLEXERIL ) 10 MG tablet Take 1 tablet (10 mg total) by mouth 2 (two) times daily as needed. (Patient not taking: Reported on 03/30/2024) 20 tablet 0   metroNIDAZOLE  (METROGEL ) 0.75 % vaginal gel Place 1 Applicatorful vaginally at bedtime. Apply one applicatorful to vagina at bedtime for 5 days (Patient not taking: Reported on 04/27/2024) 70 g 0   terconazole  (TERAZOL 3 ) 0.8 % vaginal cream Place 1 applicator vaginally at bedtime. Apply nightly for three nights. (Patient not taking: Reported on 04/27/2024) 20 g 0   No current facility-administered medications on file prior to visit.      Allergies:   No Known Allergies   Physical Exam:   Vitals:   05/14/24 0923  BP: (!) 113/53  Pulse: 91   Sitting comfortably on the sonogram table Nonlabored breathing Normal rate and  rhythm Abdomen is nontender  Thank you for the opportunity to be involved with this patient's care. Please let us  know if we can be of any further assistance.   30 minutes of time was spent reviewing the patient's chart including labs, imaging and documentation.  At least 50% of this time was spent with direct patient care discussing the diagnosis, management and prognosis of her care.  Delora Smaller MFM, [place]   05/14/2024  2:41 PM

## 2024-05-25 ENCOUNTER — Encounter: Admitting: Obstetrics and Gynecology

## 2024-06-10 ENCOUNTER — Ambulatory Visit: Admitting: Certified Nurse Midwife

## 2024-06-10 VITALS — BP 125/77 | HR 100 | Wt 222.5 lb

## 2024-06-10 DIAGNOSIS — Z3A23 23 weeks gestation of pregnancy: Secondary | ICD-10-CM

## 2024-06-10 DIAGNOSIS — B009 Herpesviral infection, unspecified: Secondary | ICD-10-CM

## 2024-06-10 DIAGNOSIS — Z3402 Encounter for supervision of normal first pregnancy, second trimester: Secondary | ICD-10-CM

## 2024-06-10 NOTE — Progress Notes (Signed)
   PRENATAL VISIT NOTE  Subjective:  Yolanda Payne is a 28 y.o. G1P0000 at [redacted]w[redacted]d being seen today for ongoing prenatal care.  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  Patient reports Braxton Hicks x1.  Contractions: Not present. Vag. Bleeding: None.  Movement: Present. Denies leaking of fluid.   The following portions of the patient's history were reviewed and updated as appropriate: allergies, current medications, past family history, past medical history, past social history, past surgical history and problem list.   Objective:    Vitals:   06/10/24 0945  BP: 125/77  Pulse: 100  Weight: 222 lb 8 oz (100.9 kg)    Fetal Status:  Fetal Heart Rate (bpm): 155   Movement: Present    General: Alert, oriented and cooperative. Patient is in no acute distress.  Skin: Skin is warm and dry. No rash noted.   Cardiovascular: Normal heart rate noted  Respiratory: Normal respiratory effort, no problems with respiration noted  Abdomen: Soft, gravid, appropriate for gestational age.  Pain/Pressure: Absent     Pelvic: Cervical exam deferred        Extremities: Normal range of motion.  Edema: None  Mental Status: Normal mood and affect. Normal behavior. Normal judgment and thought content.   Assessment and Plan:  Pregnancy: G1P0000 at [redacted]w[redacted]d 1. Encounter for supervision of normal first pregnancy in second trimester (Primary) - Doing well, feeling regular and vigorous fetal movement - Discussion of skin care during pregnancy.  - Childbirth education resources provided.   2. [redacted] weeks gestation of pregnancy - Routine PNC, anticipatory guidance.  - Fatigue - discussion of magnesium for sleep support, safe medications and sleep hygiene.   3. HSV-2  infection - Valtrex  suppression daily per patient preference. Last outbreak over the summer.   Preterm labor symptoms and general obstetric precautions including but not limited to vaginal bleeding, contractions, leaking of fluid and fetal movement were reviewed in detail with the patient. Please refer to After Visit Summary for other counseling recommendations.   Return in about 4 weeks (around 07/08/2024) for LOB with GTT.  Future Appointments  Date Time Provider Department Center  07/07/2024 10:55 AM Claudene, [place] , CNM [facility] None  07/20/2024  9:00 AM [facility] PROVIDER 1 [facility]  07/20/2024  9:30 AM [facility] US1 [facility]    Camie DELENA Rote, CNM

## 2024-06-10 NOTE — Progress Notes (Signed)
 Pt presents for rob. Pt has no questions or concerns at this time.

## 2024-06-10 NOTE — Patient Instructions (Signed)
 Magnesium in pregnancy  Magnesium is a natural electrolyte we find in our foods and beverages. It is beneficial in pregnancy for multiple complaints.  It can help with headaches, rest, muscle aches/spasm, and bowel movements.  You may take Magnesium Glycinate or Magnesium Oxide (available at drug stores and online). We recommend starting with one tablet and working up to two. Take Magnesium nightly at bedtime to promote rest.    The 2 Hour Glucose Tolerance Test  A two-hour glucose tolerance test is a screening for gestational diabetes during pregnancy. It involves fasting, drinking a glucose (sugar) solution, and having blood drawn multiple times.   How to Prepare: Eat normally in the days before the test.   Eat something with a good bit of protein the night before the test - this can be done up to midnight. Think nuts, meat, cheeses.  Avoid food and drink for at least 8 to 12 hours before the test, except for a few sips of water. The test is usually scheduled in the morning so this means no food or drink (except small sips of water or medicines) after midnight the night before.  Do not chew gum (even sugar-free), have candy, drink coffee or tea.   Tell your healthcare provider about all medications, herbs, vitamins, and supplements you take.   Test procedure:  Have blood drawn to check your blood sugar level after fasting. Drink a liquid that contains 75 grams of glucose. Have blood drawn again every 60 minutes for two hours.  Abnormal results: Abnormal blood values for a two-hour glucose tolerance test include:   Fasting: greater than or equal to 92 mg/dL (5.1 mmol/L) 1 hour: greater than or equal to 180 mg/dL (89.9 mmol/L) 2 hour: greater than or equal to 153 mg/dL (8.5 mmol/L)  When it's performed: Most pregnant patients have a glucose screening test between 24 and 28 weeks of pregnancy. The test is sometimes done early if you have a higher risk for diabetes.       We highly  recommend childbirth education to help you plan for labor and begin practicing coping skills (which will be needed with or without pain meds).  [place] Childbirth Education Options: Sign up by visiting [email or web address]  Childbirth ~ Self-Paced eClass (English and Spanish) This online class offers you the freedom to complete a childbirth education series in the comfort of your own home at your own pace.  Childbirth Class (In-Person 4-Week Series  or on Saturdays, Virtual 4-Week Series ~ [place]) This interactive in-person class series will help you and your partner prepare for your birth experience. Topics include: Labor & Birth, Comfort Measures, Breathing Techniques, Massage, Medical Interventions, Pain Management Options, Cesarean Birth, Postpartum Care, and Newborn Care  Comfort Techniques for Labor ~ In-Person Class [facility]) This interactive class is designed for parents-to-be who want to learn & practice hands-on skills to help relieve some of the discomfort of labor and encourage their babies to rotate toward the best position for birth. Moms and their partners will be able to try a variety of labor positions with birth balls and rebozos as well as practice breathing, relaxation, and visualization techniques.  Natural Childbirth Class (In-Person 5-Week Series, In-Person on Saturdays or Virtual 5-Week Series ~ [place]) This class series is designed for expectant parents who want to learn and practice natural methods of coping with the process of labor and childbirth.  Cesarean Birth Self-Paced eClass (English and Spanish) This online course provides comprehensive information you can trust as you  prepare for a possible cesarean birth. In this class, you'll learn how to make your birth and recovery comfortable and joyful through instructive video clips, animations, and activities.  Waterbirth ~ Airline pilot Interested in a waterbirth? In addition to a consultation with your  credentialed waterbirth provider, this free, informational online class will help you discover whether waterbirth is the right fit for you. Not all obstetrical practices offer waterbirth, so check with your healthcare provider.  Tour Probation officer) - [facility] [employer] our 4 minute video tour of [facility] located in [place].   [place] Parenting Education Options:  Pregnancy 101 (Virtual) Congratulations on your pregnancy! This class is geared toward moms in their first trimester, but everyone is welcome. We are excited to guide you through all aspects of supporting a healthy pregnancy. You will learn what to expect at routine prenatal care appointments, common postpartum adjustments, basic infant safety, and breastfeeding.  Successful Partnering & Parenting ~ In-Person Workshop [facility]) This workshop inspires and equips partners of all economic levels, ages, and cultures to confidently care for their infants, support the birthing persons, and navigate their own transformations into new partners and parents. Learning activities are geared towards supporting partner, but moms are welcome to attend.  'Baby & Me' Parenting Group (Virtual on Wednesdays at 11am) Enjoy this time discussing newborn & infant parenting topics and family adjustment issues with other new parents in a relaxed environment. Each week brings a new speaker or baby-centered activity. This group offers support and connection to parents as they journey through the adjustments and struggles of that sometimes overwhelming first year after the birth of a child.  Baby Safety, CPR, & Choking Class ~ Virtual This life-saving information is meant to encourage parents as they learn important safety and prevention tips as well as infant CPR and relief of choking.  Breastfeeding Class (In-Person in [place] or [employer]) Families learn what to expect in the first days  and weeks of breastfeeding your newborn.   Breastfeeding Self-Paced eClass (English & Spanish) Families learn what to expect in the first days and weeks of breastfeeding your newborn.  Caring for Baby ~ In-Person, Virtual or Self-Paced Class This in-person class is for both expectant and adoptive parents who want to learn and practice the most up-to-date newborn care for their babies. Focus is on birth through the first six weeks of life.  CPR & Choking Relief for Infants & Children ~ In-Person Class [facility]) This in-person course is designed for any parent, expectant parent, or adult who cares for infants or children. Participants learn and demonstrate cardiopulmonary resuscitation and choking relief procedures for both infants and children.  Grandparent Love ~ In-Person Class Grandparents will learn the most updated infant care and safety recommendations. They will discover ways to support their own children during the transition into the parenting role and receive tips on communicating with the new parents.   Parenting Support Group Options:  Bereavement Grief Support Group (Pregnancy/Infant Loss) - Virtual This is an ongoing experience that meets once a month and is designed to help you honor the past, assist you in discovering tools to strengthen you today, and aid you in developing hope for the future.  Breastfeeding & Pumping Support Group (In-Person on Thursdays at 12pm or Virtual on Tuesdays at 5pm) Join us  in-person each Thursday starting June 1st, 2023 at 12pm! This support group is free for all families looking for breastfeeding and/or pumping support.  Community-Based Childbirth Education Options:  [facility] Department Classes:  Childbirth education classes can help you get ready for a positive parenting experience. You can also meet other expectant parents and get free stuff for your baby. Each class runs for five weeks on the same night and costs  $45 for the mother-to-be and her support person. Medicaid covers the cost if you are eligible. Call [phone] to register.  [employer] offers a variety of programs for the [employer] and is another great way to get connected. Please go to [email or web address] for more information.  Childbirth With A Twist! Be informed of your options, get educated on birth, understand what your body is doing, learn how to cope, and have a lot of fun and laughs all while doing it either from the comfort of your couch OR in our cozy office and classroom space near the [place] airport. If you are taking a virtual class, then class is taught LIVE, so you can ask questions and receive answers in real-time from an experienced doula and childbirth educator.  This virtual childbirth education class will meet for five instruction times online.  Although we are based in [place], [place], this virtual class is open to anyone in the world. Please visit: [email or web address] for more information.  Books We Love: The Doula Guide to Childbirth by Retia Cook and Vernell Donald The First-Time Parent's Childbirth Handbook by Dr. Corean Glatter, CNM The Birth Partner by [employer]   Safe Medications in Pregnancy   Acne:  Benzoyl Peroxide  Salicylic Acid   Backache/Headache:  Tylenol : 2 regular strength every 4 hours OR               2 Extra strength every 6 hours   Colds/Coughs/Allergies:  Benadryl (alcohol free) 25 mg every 6 hours as needed  Breath right strips  Claritin  Cepacol throat lozenges  Chloraseptic throat spray  Cold-Eeze- up to three times per day  Cough drops, alcohol free  Flonase (by prescription only)  Guaifenesin  Mucinex  Robitussin DM (plain only, alcohol free)  Saline nasal spray/drops  Sudafed (pseudoephedrine) & Actifed * use only after [redacted] weeks gestation and if you do not have high blood pressure  Tylenol   Vicks  Vaporub  Zinc lozenges  Zyrtec   Constipation:  Colace  Ducolax suppositories  Fleet enema  Glycerin suppositories  Metamucil  Milk of magnesia  Miralax  Senokot  Smooth move tea   Diarrhea:  Kaopectate  Imodium A-D   *NO pepto Bismol   Hemorrhoids:  Anusol  Anusol HC  Preparation H  Tucks   Indigestion:  Tums  Maalox  Mylanta  Zantac  Pepcid   Insomnia:  Benadryl (alcohol free) 25mg  every 6 hours as needed  Tylenol  PM  Unisom, no Gelcaps   Leg Cramps:  Tums  MagGel   Nausea/Vomiting:  Bonine  Dramamine  Emetrol  Ginger extract  Sea bands  Meclizine  Nausea medication to take during pregnancy:  Unisom (doxylamine succinate 25 mg tablets) Take one tablet daily at bedtime. If symptoms are not adequately controlled, the dose can be increased to a maximum recommended dose of two tablets daily (1/2 tablet in the morning, 1/2 tablet mid-afternoon and one at bedtime).  Vitamin B6 100mg  tablets. Take one tablet twice a day (up to 200 mg per day).   Skin Rashes:  Aveeno products  Benadryl cream or 25mg  every 6 hours as needed  Calamine Lotion  1% cortisone cream   Yeast  infection:  Gyne-lotrimin 7  Monistat 7    **If taking multiple medications, please check labels to avoid duplicating the same active ingredients  **take medication as directed on the label  ** Do not exceed 4000 mg of tylenol  in 24 hours  **Do not take medications that contain aspirin  or ibuprofen

## 2024-07-07 ENCOUNTER — Ambulatory Visit: Admitting: Advanced Practice Midwife

## 2024-07-07 ENCOUNTER — Encounter: Payer: Self-pay | Admitting: Advanced Practice Midwife

## 2024-07-07 VITALS — BP 119/72 | HR 91 | Wt 228.0 lb

## 2024-07-07 DIAGNOSIS — B009 Herpesviral infection, unspecified: Secondary | ICD-10-CM | POA: Diagnosis not present

## 2024-07-07 DIAGNOSIS — Z3480 Encounter for supervision of other normal pregnancy, unspecified trimester: Secondary | ICD-10-CM

## 2024-07-07 DIAGNOSIS — Z3A27 27 weeks gestation of pregnancy: Secondary | ICD-10-CM | POA: Diagnosis not present

## 2024-07-07 DIAGNOSIS — O99212 Obesity complicating pregnancy, second trimester: Secondary | ICD-10-CM

## 2024-07-07 DIAGNOSIS — Z114 Encounter for screening for human immunodeficiency virus [HIV]: Secondary | ICD-10-CM

## 2024-07-07 NOTE — Progress Notes (Signed)
   PRENATAL VISIT NOTE  Subjective:  Yolanda Payne is a 28 y.o. G1P0000 at [redacted]w[redacted]d being seen today for ongoing prenatal care.  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   Patient reports no complaints.  Contractions: Not present. Vag. Bleeding: None.  Movement: Present. Denies leaking of fluid.   The following portions of the patient's history were reviewed and updated as appropriate: allergies, current medications, past family history, past medical history, past social history, past surgical history and problem list.   Objective:   Vitals:   07/07/24 1034  BP: 119/72  Pulse: 91  Weight: 228 lb (103.4 kg)    Fetal Status: Fetal Heart Rate (bpm): 159   Movement: Present     General:  Alert, oriented and cooperative. Patient is in no acute distress.  Skin: Skin is warm and dry. No rash noted.   Cardiovascular: Normal heart rate noted  Respiratory: Normal respiratory effort, no problems with respiration noted  Abdomen: Soft, gravid, appropriate for gestational age.  Pain/Pressure: Present     Pelvic: Cervical exam deferred        Extremities: Normal range of motion.  Edema: Trace  Mental Status: Normal mood and affect. Normal behavior. Normal judgment and thought content.   Assessment and Plan:  Pregnancy: G1P0000 at [redacted]w[redacted]d 1. Supervision of other normal pregnancy, antepartum (Primary) - Comp Met (CMET); Future - CBC; Future - Glucose Tolerance, 2 Hours w/1 Hour; Future - RPR; Future  2. HSV-2 infection  3. [redacted] weeks gestation of pregnancy - Comp Met (CMET); Future - CBC; Future - Glucose Tolerance, 2 Hours w/1 Hour; Future - RPR; Future - HIV antibody (with reflex); Future - TDaP info given  4. Pregnancy complicated by  obesity, second trimester - Protein / creatinine ratio, urine - Comp Met (CMET); Future  5. Screening for human immunodeficiency virus - HIV antibody (with reflex); Future  Preterm labor symptoms and general obstetric precautions including but not limited to vaginal bleeding, contractions, leaking of fluid and fetal movement were reviewed in detail with the patient. Please refer to After Visit Summary for other counseling recommendations.   Return in about 2 weeks (around 07/21/2024) for ROB/GTT.  Future Appointments  Date Time Provider Department Center  07/20/2024  9:00 AM [facility] PROVIDER 1 [facility]  07/20/2024  9:30 AM [facility] US1 [facility]  07/24/2024  8:15 AM [facility] LAB [facility] None  07/24/2024  9:35 AM Delores Nidia CROME, FNP [facility] None    [place] [facility]  Claudene, [facility] for [facility]

## 2024-07-07 NOTE — Patient Instructions (Signed)
[email or web address]     TDaP Vaccine Pregnancy Get the Whooping Cough Vaccine While You Are Pregnant (CDC)  It is important for women to get the whooping cough vaccine in the third trimester of each pregnancy. Vaccines are the best way to prevent this disease. There are 2 different whooping cough vaccines. Both vaccines combine protection against whooping cough, tetanus and diphtheria, but they are for different age groups: Tdap: for everyone 11 years or older, including pregnant women  DTaP: for children 2 months through 6 years of age  You need the whooping cough vaccine during each of your pregnancies The recommended time to get the shot is during your 27th through 36th week of pregnancy, preferably during the earlier part of this time period. The Centers for Disease Control and Prevention (CDC) recommends that pregnant women receive the whooping cough vaccine for adolescents and adults (called Tdap vaccine) during the third trimester of each pregnancy. The recommended time to get the shot is during your 27th through 36th week of pregnancy, preferably during the earlier part of this time period. This replaces the original recommendation that pregnant women get the vaccine only if they had not previously received it. The American College of Obstetricians and Gynecologists and the American College of Nurse-Midwives support this recommendation.  You should get the whooping cough vaccine while pregnant to pass protection to your baby frame support disabled and/or not supported in this browser  Learn why Laura decided to get the whooping cough vaccine in her 3rd trimester of pregnancy and how her baby girl was born with some protection against the disease. Also available on YouTube. After receiving the whooping cough vaccine, your body will create protective antibodies (proteins produced by the body to fight off diseases) and pass some of them to your baby before birth. These antibodies  provide your baby some short-term protection against whooping cough in early life. These antibodies can also protect your baby from some of the more serious complications that come along with whooping cough. Your protective antibodies are at their highest about 2 weeks after getting the vaccine, but it takes time to pass them to your baby. So the preferred time to get the whooping cough vaccine is early in your third trimester. The amount of whooping cough antibodies in your body decreases over time. That is why CDC recommends you get a whooping cough vaccine during each pregnancy. Doing so allows each of your babies to get the greatest number of protective antibodies from you. This means each of your babies will get the best protection possible against this disease.  Getting the whooping cough vaccine while pregnant is better than getting the vaccine after you give birth Whooping cough vaccination during pregnancy is ideal so your baby will have short-term protection as soon as he is born. This early protection is important because your baby will not start getting his whooping cough vaccines until he is 2 months old. These first few months of life are when your baby is at greatest risk for catching whooping cough. This is also when he's at greatest risk for having severe, potentially life-threating complications from the infection. To avoid that gap in protection, it is best to get a whooping cough vaccine during pregnancy. You will then pass protection to your baby before he is born. To continue protecting your baby, he should get whooping cough vaccines starting at 2 months old. You may never have gotten the Tdap vaccine before and did not get it during this pregnancy. If   so, you should make sure to get the vaccine immediately after you give birth, before leaving the hospital or birthing center. It will take about 2 weeks before your body develops protection (antibodies) in response to the vaccine. Once you  have protection from the vaccine, you are less likely to give whooping cough to your newborn while caring for him. But remember, your baby will still be at risk for catching whooping cough from others. A recent study looked to see how effective Tdap was at preventing whooping cough in babies whose mothers got the vaccine while pregnant or in the hospital after giving birth. The study found that getting Tdap between 27 through 36 weeks of pregnancy is 85% more effective at preventing whooping cough in babies younger than 2 months old. Blood tests cannot tell if you need a whooping cough vaccine There are no blood tests that can tell you if you have enough antibodies in your body to protect yourself or your baby against whooping cough. Even if you have been sick with whooping cough in the past or previously received the vaccine, you still should get the vaccine during each pregnancy. Breastfeeding may pass some protective antibodies onto your baby By breastfeeding, you may pass some antibodies you have made in response to the vaccine to your baby. When you get a whooping cough vaccine during your pregnancy, you will have antibodies in your breast milk that you can share with your baby as soon as your milk comes in. However, your baby will not get protective antibodies immediately if you wait to get the whooping cough vaccine until after delivering your baby. This is because it takes about 2 weeks for your body to create antibodies. Learn more about the health benefits of breastfeeding.  

## 2024-07-07 NOTE — Progress Notes (Signed)
 Pt presents for ROB visit. Pt c/o rectal cramping. Pt has not been scheduled for GTT. No concerns

## 2024-07-08 LAB — PROTEIN / CREATININE RATIO, URINE
Creatinine, Urine: 207.5 mg/dL
Protein, Ur: 34.9 mg/dL
Protein/Creat Ratio: 168 mg/g{creat} (ref 0–200)

## 2024-07-09 ENCOUNTER — Ambulatory Visit: Payer: Self-pay | Admitting: Advanced Practice Midwife

## 2024-07-09 DIAGNOSIS — O24410 Gestational diabetes mellitus in pregnancy, diet controlled: Secondary | ICD-10-CM

## 2024-07-20 ENCOUNTER — Ambulatory Visit

## 2024-07-20 ENCOUNTER — Ambulatory Visit: Attending: Obstetrics | Admitting: Obstetrics

## 2024-07-20 VITALS — BP 111/60 | HR 92

## 2024-07-20 DIAGNOSIS — Z3A29 29 weeks gestation of pregnancy: Secondary | ICD-10-CM | POA: Insufficient documentation

## 2024-07-20 DIAGNOSIS — Z362 Encounter for other antenatal screening follow-up: Secondary | ICD-10-CM | POA: Diagnosis not present

## 2024-07-20 DIAGNOSIS — O99212 Obesity complicating pregnancy, second trimester: Secondary | ICD-10-CM | POA: Diagnosis present

## 2024-07-20 DIAGNOSIS — E669 Obesity, unspecified: Secondary | ICD-10-CM

## 2024-07-20 DIAGNOSIS — O99213 Obesity complicating pregnancy, third trimester: Secondary | ICD-10-CM | POA: Diagnosis not present

## 2024-07-20 NOTE — Progress Notes (Signed)
 MFM Consult Note  Yolanda Payne is currently at 29 weeks and 2 days.  She has been followed due to maternal obesity with a BMI of 37.9.    She denies any problems since her last exam.  Her blood pressure today was 111/60.  Sonographic findings Single intrauterine pregnancy at 29w 2d.  Fetal cardiac activity:  Observed and appears normal. Presentation: Cephalic. Fetal biometry shows the estimated fetal weight of 3 pounds 3 ounces which measures at the 56th percentile. Amniotic fluid volume: Within normal limits.  AFI: 13.8 cm.  MVP: 4.35 cm. Placenta: Anterior.  There are limitations of prenatal ultrasound such as the inability to detect certain abnormalities due to poor visualization. Various factors such as fetal position, gestational age and maternal body habitus may increase the difficulty in visualizing the fetal anatomy.    Due to maternal obesity, she should be screened for gestational diabetes at her next prenatal visit.  As the fetal growth is within normal limits, no further exams were scheduled in our office.    We will be happy to see her again in the future if there are any issues later in her pregnancy such as hypertension or gestational diabetes.  The patient stated that all of her questions were answered today.  A total of 10 minutes was spent counseling and coordinating the care for this patient.  Greater than 50% of the time was spent in direct face-to-face contact.

## 2024-07-24 ENCOUNTER — Other Ambulatory Visit

## 2024-07-24 ENCOUNTER — Encounter: Payer: Self-pay | Admitting: Obstetrics and Gynecology

## 2024-07-24 ENCOUNTER — Ambulatory Visit (INDEPENDENT_AMBULATORY_CARE_PROVIDER_SITE_OTHER): Payer: Self-pay | Admitting: Obstetrics and Gynecology

## 2024-07-24 VITALS — BP 118/75 | HR 91 | Wt 228.4 lb

## 2024-07-24 DIAGNOSIS — Z114 Encounter for screening for human immunodeficiency virus [HIV]: Secondary | ICD-10-CM | POA: Diagnosis not present

## 2024-07-24 DIAGNOSIS — Z3480 Encounter for supervision of other normal pregnancy, unspecified trimester: Secondary | ICD-10-CM

## 2024-07-24 DIAGNOSIS — O99212 Obesity complicating pregnancy, second trimester: Secondary | ICD-10-CM

## 2024-07-24 DIAGNOSIS — Z3A29 29 weeks gestation of pregnancy: Secondary | ICD-10-CM | POA: Diagnosis not present

## 2024-07-24 DIAGNOSIS — Z23 Encounter for immunization: Secondary | ICD-10-CM

## 2024-07-24 DIAGNOSIS — B009 Herpesviral infection, unspecified: Secondary | ICD-10-CM | POA: Diagnosis not present

## 2024-07-24 NOTE — Progress Notes (Signed)
 PRENATAL VISIT NOTE  Subjective:  Yolanda Payne is a 28 y.o. G1P0000 at [redacted]w[redacted]d being seen today for ongoing prenatal care.  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  Patient reports no complaints.  Contractions: Not present. Vag. Bleeding: None.  Movement: Present. Denies leaking of fluid.   The following portions of the patient's history were reviewed and updated as appropriate: allergies, current medications, past family history, past medical history, past social history, past surgical history and problem list.   Objective:   Vitals:   07/24/24 0954  BP: 118/75  Pulse: 91  Weight: 103.6 kg    Fetal Status:    Fundal Height: 30 cm Movement: Present    General: Alert, oriented and cooperative. Patient is in no acute distress.  Skin: Skin is warm and dry. No rash noted.   Cardiovascular: Normal heart rate noted  Respiratory: Normal respiratory effort, no problems with respiration noted  Abdomen: Soft, gravid, appropriate for gestational age.  Pain/Pressure: Absent     Pelvic: Cervical exam deferred        Extremities: Normal range of motion.  Edema: None  Mental Status: Normal mood and affect. Normal behavior. Normal judgment and thought content.      07/24/2024   10:00 AM 03/30/2024    9:32 AM 02/26/2024    1:30 PM  Depression screen PHQ 2/9  Decreased Interest 0 0 0  Down, Depressed, Hopeless 0 0 0  PHQ - 2 Score 0 0 0  Altered sleeping 0 1 0  Tired, decreased energy 1 2 0  Change in appetite 0 0 0  Feeling bad or failure about yourself  0 0 0  Trouble concentrating 0 0 0  Moving slowly or fidgety/restless 0 0 0  Suicidal thoughts 0 0 0  PHQ-9 Score 1 3  0   Difficult doing work/chores  Not difficult at all      Data  saved with a previous flowsheet row definition        07/24/2024   10:01 AM 03/30/2024    9:33 AM 02/26/2024    1:31 PM 02/18/2023    9:37 AM  GAD 7 : Generalized Anxiety Score  Nervous, Anxious, on Edge 3 0 0 1  Control/stop worrying 0 0 0 1  Worry too much - different things 0 0 0 1  Trouble relaxing 0 0 0 1  Restless 0 0 0 0  Easily annoyed or irritable 0 1 0 0  Afraid - awful might happen 0 0 0 0  Total GAD 7 Score 3 1 0 4  Anxiety Difficulty  Not difficult at all      Assessment and Plan:  Pregnancy: G1P0000 at [redacted]w[redacted]d 1. Supervision of other normal pregnancy, antepartum (Primary) - Doing well, feeling regular and vigorous fetal movement  -Counseled patient on the importance of taking baby aspirin  - RPR - Glucose Tolerance, 2 Hours w/1 Hour - CBC - Comp Met (CMET)  2. [redacted] weeks gestation of pregnancy - Routine OB care  -TDAP vaccine given today  3. HSV-2 infection -Currently taking Valtrex  1000 mg daily  4. Screening for human immunodeficiency virus - HIV antibody (with reflex)  5. Pregnancy complicated by obesity, second trimester - Comp Met (CMET)   Preterm labor symptoms and general obstetric precautions including but not  limited to vaginal bleeding, contractions, leaking of fluid and fetal movement were reviewed in detail with the patient. Please refer to After Visit Summary for other counseling recommendations.   Return in about 2 weeks (around 08/07/2024) for ROB.  No future appointments.  Derrek JINNY Freund, NP Student

## 2024-07-24 NOTE — Progress Notes (Signed)
 Pt presents for ROB visit. Pt has concerns about movement

## 2024-07-25 LAB — COMPREHENSIVE METABOLIC PANEL WITH GFR
ALT: 12 IU/L (ref 0–32)
AST: 22 IU/L (ref 0–40)
Albumin: 3.8 g/dL — ABNORMAL LOW (ref 4.0–5.0)
Alkaline Phosphatase: 78 IU/L (ref 41–116)
BUN/Creatinine Ratio: 13 (ref 9–23)
BUN: 10 mg/dL (ref 6–20)
Bilirubin Total: 0.2 mg/dL (ref 0.0–1.2)
CO2: 18 mmol/L — ABNORMAL LOW (ref 20–29)
Calcium: 9.6 mg/dL (ref 8.7–10.2)
Chloride: 103 mmol/L (ref 96–106)
Creatinine, Ser: 0.77 mg/dL (ref 0.57–1.00)
Globulin, Total: 2.9 g/dL (ref 1.5–4.5)
Glucose: 90 mg/dL (ref 70–99)
Potassium: 4.5 mmol/L (ref 3.5–5.2)
Sodium: 138 mmol/L (ref 134–144)
Total Protein: 6.7 g/dL (ref 6.0–8.5)
eGFR: 106 mL/min/1.73 (ref >59)

## 2024-07-25 LAB — CBC
Hematocrit: 41.1 % (ref 34.0–46.6)
Hemoglobin: 13.9 g/dL (ref 11.1–15.9)
MCH: 30.9 pg (ref 26.6–33.0)
MCHC: 33.8 g/dL (ref 31.5–35.7)
MCV: 91 fL (ref 79–97)
Platelets: 237 x10E3/uL (ref 150–450)
RBC: 4.5 x10E6/uL (ref 3.77–5.28)
RDW: 12 % (ref 11.7–15.4)
WBC: 11.2 x10E3/uL — ABNORMAL HIGH (ref 3.4–10.8)

## 2024-07-25 LAB — GLUCOSE TOLERANCE, 2 HOURS W/ 1HR
Glucose, 1 hour: 178 mg/dL (ref 70–179)
Glucose, 2 hour: 190 mg/dL — ABNORMAL HIGH (ref 70–152)
Glucose, Fasting: 90 mg/dL (ref 70–91)

## 2024-07-25 LAB — RPR: RPR Ser Ql: NONREACTIVE

## 2024-07-25 LAB — HIV ANTIBODY (ROUTINE TESTING W REFLEX): HIV Screen 4th Generation wRfx: NONREACTIVE

## 2024-07-28 ENCOUNTER — Telehealth: Payer: Self-pay

## 2024-07-28 DIAGNOSIS — O24410 Gestational diabetes mellitus in pregnancy, diet controlled: Secondary | ICD-10-CM | POA: Insufficient documentation

## 2024-07-28 MED ORDER — ACCU-CHEK SOFTCLIX LANCETS MISC: 1.0000 | Freq: Four times a day (QID) | 12 refills | Status: DC

## 2024-07-28 MED ORDER — ACCU-CHEK GUIDE W/DEVICE KIT: 1.0000 | PACK | 0 refills | Status: DC

## 2024-07-28 MED ORDER — ACCU-CHEK GUIDE TEST VI STRP: 1.0000 | ORAL_STRIP | Freq: Four times a day (QID) | 12 refills | Status: DC

## 2024-08-04 ENCOUNTER — Encounter: Attending: Advanced Practice Midwife | Admitting: Dietician

## 2024-08-04 ENCOUNTER — Encounter: Payer: Self-pay | Admitting: Obstetrics & Gynecology

## 2024-08-04 ENCOUNTER — Ambulatory Visit (INDEPENDENT_AMBULATORY_CARE_PROVIDER_SITE_OTHER): Admitting: Obstetrics & Gynecology

## 2024-08-04 ENCOUNTER — Ambulatory Visit: Admitting: Dietician

## 2024-08-04 VITALS — BP 111/73 | HR 91 | Wt 231.7 lb

## 2024-08-04 DIAGNOSIS — O24410 Gestational diabetes mellitus in pregnancy, diet controlled: Secondary | ICD-10-CM

## 2024-08-04 DIAGNOSIS — Z3A31 31 weeks gestation of pregnancy: Secondary | ICD-10-CM | POA: Diagnosis not present

## 2024-08-04 DIAGNOSIS — Z3A00 Weeks of gestation of pregnancy not specified: Secondary | ICD-10-CM | POA: Insufficient documentation

## 2024-08-04 DIAGNOSIS — Z713 Dietary counseling and surveillance: Secondary | ICD-10-CM | POA: Insufficient documentation

## 2024-08-04 DIAGNOSIS — O99212 Obesity complicating pregnancy, second trimester: Secondary | ICD-10-CM

## 2024-08-04 DIAGNOSIS — Z3480 Encounter for supervision of other normal pregnancy, unspecified trimester: Secondary | ICD-10-CM

## 2024-08-04 MED ORDER — DEXCOM G7 SENSOR MISC: 3 refills | Status: DC

## 2024-08-04 NOTE — Progress Notes (Signed)
 Patient was seen for Gestational Diabetes on 08/04/2024  Start time 0955 and End time 1105   Estimated due date: 10/04/2023; 31 w3d  Clinical: Medications: prenatal vitamin, baby asparin Medical History: GDM (new)h Labs: OGTT fasting 90, 1 hour 178, 2 hour 190 on 07/24/2024, A1c 5.5% on 03/30/2024  Dietary and Lifestyle History: Patient lives with her boyfriend.  They share shopping and he does most of the cooking.  She is working at [employer] - morning shift. She gets [facility] and food stamps.  Physical Activity: walking 30 minutes per day Stress: normal Sleep: good  24 hr Recall:  First Meal:  8:45 am - Special K yogurt and berry cereal, Lactaid 2% milk Snack:  Little Debbie Oatmeal cookie Second meal:  macaroni and cheese, biscuit, meatballs Snack:  none Third meal:  none Snack:  none Beverages:  water, juice  NUTRITION INTERVENTION  Nutrition education (E-1) on the following topics:   Initial Follow-up  [x]  []  Definition of Gestational Diabetes [x]  []  Why dietary management is important in controlling blood glucose Poorly controlled diabetes can lead to fetal macrosomia, shoulder dystocia and neurological injuries, stillbirth and neonatal complications including respiratory distress syndrome, hypoglycemia and prolonged NICU admission.  [x]  []  Effects each nutrient has on blood glucose levels [x]  []  Simple carbohydrates vs complex carbohydrates [x]  []  Fluid intake [x]  []  Creating a balanced meal plan [x]  []  Carbohydrate counting  [x]  []  When to check blood glucose levels [x]  []  Proper blood glucose monitoring techniques [x]  []  Effect of stress and stress reduction techniques  [x]  []  Exercise effect on blood glucose levels, appropriate exercise during pregnancy [x]  []  Importance of limiting caffeine  and abstaining from alcohol and smoking [x]  []  Medications used for blood sugar control during pregnancy [x]  []  Hypoglycemia and rule of 15 [x]  []  Postpartum self care  Blood  glucose monitor given: Accu Chek Guide Me Lot # [id number] Exp: 02/09/2025 CBG: 188 mg/dL 8.74 hours after breakfast Patient has helped her grandmother give insulin and knows how to give this.  Dexcom also provided: Lot [id number] Expiration 3/31/2-26 Patient has a doctor's visit after this visit and should ask for a prescription for the Dexcom G7 as well as discuss her blood glucose and medication needs.  Patient instructed to monitor glucose levels: FBS: 60 - <= 95 mg/dL; 2 hour: <= 879 mg/dL  Patient received handouts: Nutrition Diabetes and Pregnancy Carbohydrate Counting List Blood glucose log Snack ideas for diabetes during pregnancy  Patient will be seen for follow-up as needed.

## 2024-08-04 NOTE — Progress Notes (Signed)
 PRENATAL VISIT NOTE  Subjective:  Yolanda Payne is a 28 y.o. G1P0000 at [redacted]w[redacted]d being seen today for ongoing prenatal care.  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  Patient reports no complaints.  Contractions: Irritability. Vag. Bleeding: None.  Movement: Present. Denies leaking of fluid.   The following portions of the patient's history were reviewed and updated as appropriate: allergies, current medications, past family history, past medical history, past social history, past surgical history and problem list.   Objective:   Vitals:   08/04/24 1133  BP: 111/73  Pulse: 91  Weight: 231 lb 11.2 oz (105.1 kg)    Fetal Status:  Fetal Heart Rate (bpm): 146   Movement: Present    General: Alert, oriented and cooperative. Patient is in no acute distress.  Skin: Skin is warm and dry. No rash noted.   Cardiovascular: Normal heart rate noted  Respiratory: Normal respiratory effort, no problems with respiration noted  Abdomen: Soft, gravid, appropriate for gestational age.  Pain/Pressure: Absent     Pelvic: Cervical exam deferred        Extremities: Normal range of motion.  Edema: Trace  Mental Status: Normal mood and affect. Normal behavior. Normal judgment and thought content.      07/24/2024   10:00 AM 03/30/2024    9:32 AM 02/26/2024    1:30 PM  Depression screen PHQ 2/9  Decreased Interest 0 0 0  Down, Depressed, Hopeless 0 0 0  PHQ - 2 Score 0 0 0  Altered sleeping 0 1 0  Tired, decreased energy 1 2 0  Change in appetite 0 0 0  Feeling bad or failure about yourself  0 0 0  Trouble concentrating 0 0 0  Moving slowly or fidgety/restless 0 0 0  Suicidal  thoughts 0 0 0  PHQ-9 Score 1 3  0   Difficult doing work/chores  Not difficult at all      Data saved with a previous flowsheet row definition        07/24/2024   10:01 AM 03/30/2024    9:33 AM 02/26/2024    1:31 PM 02/18/2023    9:37 AM  GAD 7 : Generalized Anxiety Score  Nervous, Anxious, on Edge 3 0 0 1  Control/stop worrying 0 0 0 1  Worry too much - different things 0 0 0 1  Trouble relaxing 0 0 0 1  Restless 0 0 0 0  Easily annoyed or irritable 0 1 0 0  Afraid - awful might happen 0 0 0 0  Total GAD 7 Score 3 1 0 4  Anxiety Difficulty  Not difficult at all      Assessment and Plan:  Pregnancy: G1P0000 at [redacted]w[redacted]d 1. Diet controlled gestational diabetes mellitus (GDM) in third trimester (Primary) BG 130 today - US  MFM OB FOLLOW UP; Future - Continuous Glucose Sensor (DEXCOM G7 SENSOR) MISC; Use one sensor every 10 days as instructed  Dispense: 9 each; Refill: 3  2. Supervision of other normal pregnancy, antepartum   3. Pregnancy complicated by obesity, second trimester   4. [redacted] weeks gestation of pregnancy   Preterm labor symptoms and general obstetric precautions including but not limited to vaginal bleeding, contractions, leaking of fluid and fetal movement  were reviewed in detail with the patient. Please refer to After Visit Summary for other counseling recommendations.   Return in about 1 week (around 08/11/2024).  Future Appointments  Date Time Provider Department Center  08/10/2024 10:55 AM Rudy Carlin LABOR, MD [facility] None    Lynwood Solomons, MD

## 2024-08-04 NOTE — Progress Notes (Signed)
 GDM; picked up supplies and education today. Today's blood sugar 188 following breakfast.   No other concerns today.

## 2024-08-04 NOTE — Progress Notes (Deleted)
   PRENATAL VISIT NOTE  Subjective:  Yolanda Payne is a 28 y.o. G1P0000 at [redacted]w[redacted]d being seen today for ongoing prenatal care.  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  Patient reports {sx:[id number]}.  Contractions: Irritability. Vag. Bleeding: None.  Movement: Present. Denies leaking of fluid.   The following portions of the patient's history were reviewed and updated as appropriate: allergies, current medications, past family history, past medical history, past social history, past surgical history and problem list.   Objective:   Vitals:   08/04/24 1133  BP: 111/73  Pulse: 91  Weight: 231 lb 11.2 oz (105.1 kg)    Fetal Status:  Fetal Heart Rate (bpm): 146   Movement: Present    General: Alert, oriented and cooperative. Patient is in no acute distress.  Skin: Skin is warm and dry. No rash noted.   Cardiovascular: Normal heart rate noted  Respiratory: Normal respiratory effort, no problems with respiration noted  Abdomen: Soft, gravid, appropriate for gestational age.  Pain/Pressure: Absent     Pelvic: {Blank single:19197::Cervical exam performed in the presence of a chaperone,Cervical exam deferred}        Extremities: Normal range of motion.  Edema: Trace  Mental Status: Normal mood and affect. Normal behavior. Normal judgment and thought content.      07/24/2024   10:00 AM 03/30/2024    9:32 AM 02/26/2024    1:30 PM  Depression screen PHQ 2/9  Decreased Interest 0 0 0  Down, Depressed, Hopeless 0 0 0  PHQ - 2 Score 0 0 0  Altered sleeping 0 1 0  Tired, decreased energy 1 2 0  Change in appetite 0 0 0  Feeling bad or  failure about yourself  0 0 0  Trouble concentrating 0 0 0  Moving slowly or fidgety/restless 0 0 0  Suicidal thoughts 0 0 0  PHQ-9 Score 1 3  0   Difficult doing work/chores  Not difficult at all      Data saved with a previous flowsheet row definition        07/24/2024   10:01 AM 03/30/2024    9:33 AM 02/26/2024    1:31 PM 02/18/2023    9:37 AM  GAD 7 : Generalized Anxiety Score  Nervous, Anxious, on Edge 3 0 0 1  Control/stop worrying 0 0 0 1  Worry too much - different things 0 0 0 1  Trouble relaxing 0 0 0 1  Restless 0 0 0 0  Easily annoyed or irritable 0 1 0 0  Afraid - awful might happen 0 0 0 0  Total GAD 7 Score 3 1 0 4  Anxiety Difficulty  Not difficult at all      Assessment and Plan:  Pregnancy: G1P0000 at [redacted]w[redacted]d There are no diagnoses linked to this encounter. {Blank single:19197::Term,Preterm} labor symptoms and general obstetric precautions including but not limited to vaginal bleeding, contractions, leaking of fluid and fetal movement were reviewed in detail with the patient. Please refer to After Visit Summary for other counseling recommendations.   No follow-ups on file.  No future appointments.  Lynwood Solomons, MD

## 2024-08-10 ENCOUNTER — Encounter: Admitting: Obstetrics

## 2024-08-17 ENCOUNTER — Inpatient Hospital Stay (HOSPITAL_COMMUNITY)
Admission: AD | Admit: 2024-08-17 | Discharge: 2024-08-17 | Disposition: A | Discharge status: Home / Self Care | Attending: Obstetrics & Gynecology | Admitting: Obstetrics & Gynecology

## 2024-08-17 ENCOUNTER — Other Ambulatory Visit: Payer: Self-pay

## 2024-08-17 ENCOUNTER — Encounter (HOSPITAL_COMMUNITY): Payer: Self-pay | Admitting: Obstetrics & Gynecology

## 2024-08-17 DIAGNOSIS — R109 Unspecified abdominal pain: Secondary | ICD-10-CM | POA: Diagnosis not present

## 2024-08-17 DIAGNOSIS — O26899 Other specified pregnancy related conditions, unspecified trimester: Secondary | ICD-10-CM

## 2024-08-17 DIAGNOSIS — Z3A33 33 weeks gestation of pregnancy: Secondary | ICD-10-CM

## 2024-08-17 DIAGNOSIS — O26893 Other specified pregnancy related conditions, third trimester: Secondary | ICD-10-CM

## 2024-08-17 LAB — URINALYSIS, ROUTINE W REFLEX MICROSCOPIC
Bilirubin Urine: NEGATIVE
Glucose, UA: NEGATIVE mg/dL
Hgb urine dipstick: NEGATIVE
Ketones, ur: 5 mg/dL — AB
Nitrite: NEGATIVE
Protein, ur: 30 mg/dL — AB
Specific Gravity, Urine: 1.021 (ref 1.005–1.030)
pH: 6 (ref 5.0–8.0)

## 2024-08-17 LAB — WET PREP, GENITAL
Clue Cells Wet Prep HPF POC: NONE SEEN
Sperm: NONE SEEN
Trich, Wet Prep: NONE SEEN
WBC, Wet Prep HPF POC: >=10 — AB (ref <10)
Yeast Wet Prep HPF POC: NONE SEEN

## 2024-08-17 NOTE — Discharge Instructions (Signed)

## 2024-08-17 NOTE — MAU Provider Note (Signed)
 Chief Complaint:  Cramping   HPI   None     Yolanda Payne is a 28 y.o. G1P0000 at [redacted]w[redacted]d who presents to maternity admissions reporting she lost her mucus plus and has lower abdominal heaviness and abdominal cramping. She reports that she lost her mucus plug this morning. Throughout the day she has felt her lower abdomen is heavy. She started having lower abdominal cramping at around 1100. Denies vaginal bleeding, leaking of fluid. Endorses good fetal movement.  Pregnancy Course: Receives care at [facility] for [facility] . Prenatal records reviewed. Pregnancy complicated by gDM, HSV, HPV, BMI >35.  Past Medical History:  Diagnosis Date   Bacterial vaginosis 02/18/2023   HSV (herpes simplex virus) infection    OB History  Gravida Para Term Preterm AB Living  1 0 0 0 0 0  SAB IAB Ectopic Multiple Live Births  0 0 0 0 0    # Outcome Date GA Lbr Len/2nd Weight Sex Type Anes PTL Lv  1 Current            Past Surgical History:  Procedure Laterality Date   WISDOM TOOTH EXTRACTION     Family History  Problem Relation Age of Onset   Healthy Mother    Healthy Father    Hypertension Father    Social History   Tobacco Use   Smoking status: Never   Smokeless tobacco: Never  Vaping Use   Vaping status: Never Used  Substance Use Topics   Alcohol use: Not Currently   Drug use: No   No Known Allergies Medications Prior to Admission  Medication Sig Dispense Refill Last Dose/Taking   metroNIDAZOLE  (FLAGYL ) 500 MG tablet Take 500 mg by mouth 2 (two) times daily.   Taking   Prenatal Vit-Fe Fumarate-FA (PREPLUS) 27-1 MG TABS Take 1 tablet by mouth daily. 90 tablet 3 08/17/2024 at  4:00 AM   valACYclovir  (VALTREX ) 1000 MG tablet Take 1 tablet (1,000 mg total) by mouth daily. 90 tablet 3 08/17/2024 at  4:00 AM   Accu-Chek Softclix Lancets lancets 1 each by Other route 4 (four) times daily. 100 each 12    aspirin  EC 81 MG tablet Take 1 tablet (81 mg total) by mouth daily.  Take after 12 weeks for prevention of preeclampsia later in pregnancy (Patient not taking: Reported on 07/24/2024) 300 tablet 2    Blood Glucose Monitoring Suppl (ACCU-CHEK GUIDE) w/Device KIT 1 Device by Does not apply route as directed. 1 kit 0    Blood Pressure Monitoring (BLOOD PRESSURE KIT) DEVI 1 Device by Does not apply route once a week. (Patient not taking: Reported on 08/04/2024) 1 each 0    Continuous Glucose Sensor (DEXCOM G7 SENSOR) MISC Use one sensor every 10 days as instructed 9 each 3    cyclobenzaprine  (FLEXERIL ) 10 MG tablet Take 1 tablet (10 mg total) by mouth 2 (two) times daily as needed. (Patient not taking: Reported on 07/24/2024) 20 tablet 0    glucose blood (ACCU-CHEK GUIDE TEST) test strip 1 each by Other route 4 (four) times daily. 100 each 12     I have reviewed patient's Past Medical Hx, Surgical Hx, Family Hx, Social Hx, medications and allergies.   ROS  Pertinent items noted in HPI and remainder of comprehensive ROS otherwise negative.   PHYSICAL EXAM  Patient Vitals for the past 24 hrs:  BP Temp Temp src Pulse Resp SpO2 Height Weight  08/17/24 1104 107/61 98.7 F (37.1 C) Oral 81 18 99 % -- --  08/17/24 1059 -- -- -- -- -- -- 5' 3 (1.6 m) 103.6 kg    Constitutional: Well-developed, well-nourished female in no acute distress.  Cardiovascular: normal rate & rhythm, warm and well-perfused Respiratory: normal effort, no problems with respiration noted GI: Abd soft, non-tender, non-distended MSK: Extremities nontender, no edema, normal ROM Skin: warm and dry. Acyanotic, no jaundice or pallor. Neurologic: Alert and oriented x 4. No abnormal coordination. Psychiatric: Normal mood. Speech not slurred, not rapid/pressured. Patient is cooperative. GU: no CVA tenderness Pelvic exam: VULVA: normal appearing vulva with no masses, tenderness or lesions, VAGINA: normal appearing vagina with normal color and discharge, no lesions, CERVIX: visually closed os, white  discharge, exam chaperoned by Leala Blush RN.      Fetal Tracing: Baseline FHR: 140 per minute Fetal heart variability: moderate Fetal Heart Rate accelerations: yes Fetal Heart Rate decelerations: none Fetal Non-stress Test: Category I (reactive) Toco: occasional irregular uterine contractions   Labs: Results for orders placed or performed during the hospital encounter of 08/17/24 (from the past 24 hours)  Urinalysis, Routine w reflex microscopic -Urine, Clean Catch     Status: Abnormal   Collection Time: 08/17/24 12:00 PM  Result Value Ref Range   Color, Urine AMBER (A) YELLOW   APPearance CLOUDY (A) CLEAR   Specific Gravity, Urine 1.021 1.005 - 1.030   pH 6.0 5.0 - 8.0   Glucose, UA NEGATIVE NEGATIVE mg/dL   Hgb urine dipstick NEGATIVE NEGATIVE   Bilirubin Urine NEGATIVE NEGATIVE   Ketones, ur 5 (A) NEGATIVE mg/dL   Protein, ur 30 (A) NEGATIVE mg/dL   Nitrite NEGATIVE NEGATIVE   Leukocytes,Ua LARGE (A) NEGATIVE   RBC / HPF 0-5 0 - 5 RBC/hpf   WBC, UA 11-20 0 - 5 WBC/hpf   Bacteria, UA MANY (A) NONE SEEN   Squamous Epithelial / HPF 11-20 0 - 5 /HPF   Mucus PRESENT   Wet prep, genital     Status: Abnormal   Collection Time: 08/17/24  1:36 PM   Specimen: Vaginal  Result Value Ref Range   Yeast Wet Prep HPF POC NONE SEEN NONE SEEN   Trich, Wet Prep NONE SEEN NONE SEEN   Clue Cells Wet Prep HPF POC NONE SEEN NONE SEEN   WBC, Wet Prep HPF POC >=10 (A) <10   Sperm NONE SEEN     Imaging:  No results found.  MDM & MAU COURSE  MDM: Moderate  MAU Course: -Vital signs within normal limits. -Cervix visually closed, not in labor. -UA with leukocytes and bacteria but no nitrites, will send for culture. -Wet prep negative for infection.  Differential diagnosis considered for lower abdominal pain includes but is not limited to: round ligament pain, UTI, pyelonephritis, PID, cervicitis, chorioamnionitis, appendicitis, diverticulitis, constipation, nephrolithiasis  Orders  Placed This Encounter  Procedures   Wet prep, genital   Culture, OB Urine   Urinalysis, Routine w reflex microscopic -Urine, Clean Catch   Discharge patient   No orders of the defined types were placed in this encounter.   ASSESSMENT   1. Abdominal cramping affecting pregnancy   2. [redacted] weeks gestation of pregnancy     PLAN  Discharge home in stable condition with preterm labor precautions.  Encourage increased PO fluid intake and high protein snack/meal every 3 hours.     Allergies as of 08/17/2024   No Known Allergies      Medication List     TAKE these medications    Accu-Chek Guide Test test strip  Generic drug: glucose blood 1 each by Other route 4 (four) times daily.   Accu-Chek Guide w/Device Kit 1 Device by Does not apply route as directed.   Accu-Chek Softclix Lancets lancets 1 each by Other route 4 (four) times daily.   aspirin  EC 81 MG tablet Take 1 tablet (81 mg total) by mouth daily. Take after 12 weeks for prevention of preeclampsia later in pregnancy   Blood Pressure Kit Devi 1 Device by Does not apply route once a week.   cyclobenzaprine  10 MG tablet Commonly known as: FLEXERIL  Take 1 tablet (10 mg total) by mouth 2 (two) times daily as needed.   Dexcom G7 Sensor Misc Use one sensor every 10 days as instructed   metroNIDAZOLE  500 MG tablet Commonly known as: FLAGYL  Take 500 mg by mouth 2 (two) times daily.   PrePLUS 27-1 MG Tabs Take 1 tablet by mouth daily.   valACYclovir  1000 MG tablet Commonly known as: Valtrex  Take 1 tablet (1,000 mg total) by mouth daily.        Joesph DELENA Sear, PA

## 2024-08-17 NOTE — MAU Note (Signed)
 Yolanda Payne is a 28 y.o. at [redacted]w[redacted]d here in MAU reporting: she lost her mucus plug this morning and is feeling a heaviness in her lower abdomen.  States she also just began to have abdominal cramping at 1057.  Denies VB and LOF.  Reports +FM.  LMP: 12/28/2023 Onset of complaint: today Pain score: 2 Vitals:   08/17/24 1104  BP: 107/61  Pulse: 81  Resp: 18  Temp: 98.7 F (37.1 C)  SpO2: 99%     FHT: 144 bpm  Lab orders placed from triage: UA

## 2024-08-18 ENCOUNTER — Encounter: Payer: Self-pay | Admitting: Obstetrics

## 2024-08-18 ENCOUNTER — Ambulatory Visit: Admitting: Obstetrics

## 2024-08-18 ENCOUNTER — Ambulatory Visit: Payer: Self-pay | Admitting: Family Medicine

## 2024-08-18 VITALS — BP 113/72 | HR 98 | Wt 229.6 lb

## 2024-08-18 DIAGNOSIS — R8271 Bacteriuria: Secondary | ICD-10-CM

## 2024-08-18 DIAGNOSIS — O99210 Obesity complicating pregnancy, unspecified trimester: Secondary | ICD-10-CM

## 2024-08-18 DIAGNOSIS — O24410 Gestational diabetes mellitus in pregnancy, diet controlled: Secondary | ICD-10-CM

## 2024-08-18 DIAGNOSIS — O0990 Supervision of high risk pregnancy, unspecified, unspecified trimester: Secondary | ICD-10-CM

## 2024-08-18 DIAGNOSIS — B009 Herpesviral infection, unspecified: Secondary | ICD-10-CM

## 2024-08-18 LAB — CULTURE, OB URINE: Culture: 1000 — AB

## 2024-08-18 MED ORDER — AMOXICILLIN 500 MG PO CAPS: 500.0000 mg | ORAL_CAPSULE | Freq: Three times a day (TID) | ORAL | 0 refills | Status: AC

## 2024-08-18 NOTE — Progress Notes (Signed)
 Subjective:  Yolanda Payne is a 28 y.o. G1P0000 at [redacted]w[redacted]d being seen today for ongoing prenatal care.  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  Patient reports heartburn.  Contractions: Irritability. Vag. Bleeding: None.  Movement: Present. Denies leaking of fluid.   The following portions of the patient's history were reviewed and updated as appropriate: allergies, current medications, past family history, past medical history, past social history, past surgical history and problem list. Problem list updated.  Objective:   Vitals:   08/18/24 1103  BP: 113/72  Pulse: 98  Weight: 229 lb 9.6 oz (104.1 kg)    Fetal Status:     Movement: Present     General:  Alert, oriented and cooperative. Patient is in no acute distress.  Skin: Skin is warm and dry. No rash noted.   Cardiovascular: Normal heart rate noted  Respiratory: Normal respiratory effort, no problems with respiration noted  Abdomen: Soft, gravid, appropriate for gestational age. Pain/Pressure: Absent     Pelvic:  Cervical exam deferred        Extremities: Normal range of motion.  Edema: None  Mental Status: Normal mood and affect. Normal behavior. Normal judgment and thought content.   Urinalysis:      Assessment and Plan:  Pregnancy: G1P0000 at [redacted]w[redacted]d  1. Supervision of high risk pregnancy, antepartum (Primary)  2. Diet controlled gestational diabetes mellitus (GDM) in third trimester - poor compliance with glucose monitoring - encouraged to monitor glucose, and record on log fasting and 2 hour PP lunch and supper; and bring log to prenatal visits Rx: - US  MFM OB FOLLOW UP;  Future  3. GBS bacteriuria, treated - treat in labor  4. HSV-2 infection - Valtrex  prn, and start suppression at 32 weeks  5. Obesity affecting pregnancy, antepartum, unspecified obesity type    There are no diagnoses linked to this encounter. Preterm labor symptoms and general obstetric precautions including but not limited to vaginal bleeding, contractions, leaking of fluid and fetal movement were reviewed in detail with the patient. Please refer to After Visit Summary for other counseling recommendations.   Return in about 2 weeks (around 09/01/2024) for [facility].   Rudy Carlin LABOR, MD 08/18/2024

## 2024-08-18 NOTE — Progress Notes (Signed)
 Pt presents for rob. Pt went to the ER on 12/8 due to possibly losing her mucus plug. Pt has questions about her having gestational diabetes (gave pt a pamphlet to read). No other questions or concerns at this time.

## 2024-08-21 ENCOUNTER — Telehealth: Payer: Self-pay | Admitting: Dietician

## 2024-08-21 NOTE — Telephone Encounter (Signed)
 Returned patient call.  She stated that she did not have lancets for her blood glucose meter but that has since been called in.  No further needs at this time.  Leita Constable, RD, LDN, CDCES, DipACLM

## 2024-08-27 ENCOUNTER — Ambulatory Visit

## 2024-08-27 DIAGNOSIS — Z3480 Encounter for supervision of other normal pregnancy, unspecified trimester: Secondary | ICD-10-CM

## 2024-08-27 DIAGNOSIS — O99212 Obesity complicating pregnancy, second trimester: Secondary | ICD-10-CM

## 2024-08-27 DIAGNOSIS — R8271 Bacteriuria: Secondary | ICD-10-CM

## 2024-08-28 ENCOUNTER — Other Ambulatory Visit: Payer: Self-pay

## 2024-08-28 ENCOUNTER — Inpatient Hospital Stay (HOSPITAL_COMMUNITY)
Admission: AD | Admit: 2024-08-28 | Discharge: 2024-08-28 | Disposition: A | Discharge status: Home / Self Care | Attending: Obstetrics and Gynecology | Admitting: Obstetrics and Gynecology

## 2024-08-28 ENCOUNTER — Inpatient Hospital Stay (HOSPITAL_COMMUNITY)

## 2024-08-28 DIAGNOSIS — O289 Unspecified abnormal findings on antenatal screening of mother: Secondary | ICD-10-CM

## 2024-08-28 DIAGNOSIS — O24410 Gestational diabetes mellitus in pregnancy, diet controlled: Secondary | ICD-10-CM | POA: Diagnosis not present

## 2024-08-28 DIAGNOSIS — B3731 Acute candidiasis of vulva and vagina: Secondary | ICD-10-CM | POA: Diagnosis not present

## 2024-08-28 DIAGNOSIS — Z3689 Encounter for other specified antenatal screening: Secondary | ICD-10-CM | POA: Diagnosis present

## 2024-08-28 DIAGNOSIS — O98813 Other maternal infectious and parasitic diseases complicating pregnancy, third trimester: Secondary | ICD-10-CM | POA: Diagnosis not present

## 2024-08-28 DIAGNOSIS — N9489 Other specified conditions associated with female genital organs and menstrual cycle: Secondary | ICD-10-CM

## 2024-08-28 DIAGNOSIS — O23593 Infection of other part of genital tract in pregnancy, third trimester: Secondary | ICD-10-CM | POA: Insufficient documentation

## 2024-08-28 DIAGNOSIS — O288 Other abnormal findings on antenatal screening of mother: Secondary | ICD-10-CM | POA: Diagnosis not present

## 2024-08-28 DIAGNOSIS — L299 Pruritus, unspecified: Secondary | ICD-10-CM | POA: Diagnosis not present

## 2024-08-28 DIAGNOSIS — Z3A34 34 weeks gestation of pregnancy: Secondary | ICD-10-CM

## 2024-08-28 DIAGNOSIS — E669 Obesity, unspecified: Secondary | ICD-10-CM

## 2024-08-28 DIAGNOSIS — O99213 Obesity complicating pregnancy, third trimester: Secondary | ICD-10-CM

## 2024-08-28 DIAGNOSIS — O368330 Maternal care for abnormalities of the fetal heart rate or rhythm, third trimester, not applicable or unspecified: Secondary | ICD-10-CM | POA: Insufficient documentation

## 2024-08-28 DIAGNOSIS — O26893 Other specified pregnancy related conditions, third trimester: Secondary | ICD-10-CM | POA: Insufficient documentation

## 2024-08-28 DIAGNOSIS — O4703 False labor before 37 completed weeks of gestation, third trimester: Secondary | ICD-10-CM | POA: Diagnosis not present

## 2024-08-28 LAB — WET PREP, GENITAL
Clue Cells Wet Prep HPF POC: NONE SEEN
Sperm: NONE SEEN
Trich, Wet Prep: NONE SEEN
WBC, Wet Prep HPF POC: >=10 — AB
Yeast Wet Prep HPF POC: NONE SEEN

## 2024-08-28 LAB — URINALYSIS, ROUTINE W REFLEX MICROSCOPIC
Bilirubin Urine: NEGATIVE
Glucose, UA: NEGATIVE mg/dL
Ketones, ur: NEGATIVE mg/dL
Nitrite: NEGATIVE
Protein, ur: NEGATIVE mg/dL
Specific Gravity, Urine: 1.025 (ref 1.005–1.030)
pH: 6 (ref 5.0–8.0)

## 2024-08-28 LAB — URINALYSIS, MICROSCOPIC (REFLEX)

## 2024-08-28 MED ORDER — FLUCONAZOLE 150 MG PO TABS: 150.0000 mg | ORAL_TABLET | Freq: Every day | ORAL | 0 refills | Status: DC

## 2024-08-28 MED ORDER — FLUCONAZOLE 150 MG PO TABS
150.0000 mg | ORAL_TABLET | Freq: Once | ORAL | Status: AC
  Administered 2024-08-28: 150 mg via ORAL
  Filled 2024-08-28: qty 1

## 2024-08-28 NOTE — MAU Provider Note (Addendum)
 " History     [id number]  Arrival date and time: 08/28/24 1146    Chief Complaint  Patient presents with   Abdominal Pain   Contractions   Vaginal Discharge   Vaginal Itching   Pelvic Pain     HPI Yolanda Payne is a 28 y.o. at [redacted]w[redacted]d by LMP, who presents for uterine cramping and discharge. She states that she was recently on amoxicillin  for a UTI and then started getting a yeast infection with vaginal irritation and itching. She tried monostat at home but it didn't work. She denies VB, LOF. Endorses good FM.     O/Positive/-- (07/21 1008)  Past Medical History:  Diagnosis Date   Bacterial vaginosis 02/18/2023   HSV (herpes simplex virus) infection     Past Surgical History:  Procedure Laterality Date   WISDOM TOOTH EXTRACTION      Family History  Problem Relation Age of Onset   Healthy Mother    Healthy Father    Hypertension Father     Social History   Socioeconomic History   Marital status: Single    Spouse name: Not on file   Number of children: Not on file   Years of education: Not on file   Highest education level: Not on file  Occupational History   Not on file  Tobacco Use   Smoking status: Never   Smokeless tobacco: Never  Vaping Use   Vaping status: Never Used  Substance and Sexual Activity   Alcohol use: Not Currently   Drug use: No   Sexual activity: Not Currently    Partners: Male    Birth control/protection: None  Other Topics Concern   Not on file  Social History Narrative   Not on file   Social Drivers of Health   Tobacco Use: Low Risk (08/18/2024)   Patient History    Smoking Tobacco Use: Never    Smokeless Tobacco Use: Never    Passive Exposure: Not on file  Financial Resource Strain: Low Risk (08/13/2022)   Overall Financial Resource Strain (CARDIA)    Difficulty of Paying Living Expenses: Not hard at all  Food Insecurity: No Food Insecurity (08/13/2022)   Hunger Vital Sign    Worried About Running Out of Food in the  Last Year: Never true    Ran Out of Food in the Last Year: Never true  Transportation Needs: No Transportation Needs (08/13/2022)   PRAPARE - Administrator, Civil Service (Medical): No    Lack of Transportation (Non-Medical): No  Physical Activity: Inactive (08/13/2022)   Exercise Vital Sign    Days of Exercise per Week: 0 days    Minutes of Exercise per Session: 0 min  Stress: No Stress Concern Present (08/13/2022)   [employer] of Occupational Health - Occupational Stress Questionnaire    Feeling of Stress : Not at all  Social Connections: Not on file  Intimate Partner Violence: Not on file  Depression (PHQ2-9): Low Risk (07/24/2024)   Depression (PHQ2-9)    PHQ-2 Score: 1  Alcohol Screen: Not on file  Housing: Not on file  Utilities: Not At Risk (08/09/2022)   AHC Utilities    Threatened with loss of utilities: No  Health Literacy: Not on file    Allergies[1]  Medications Ordered Prior to Encounter[2]  Pertinent positives and negative per HPI, all others reviewed and negative  Physical Exam   BP 115/81 (BP Location: Right Arm)   Pulse 92   Temp 98.3 F (36.8  C) (Oral)   Resp 18   LMP 12/28/2023 (Exact Date)   Patient Vitals for the past 24 hrs:  BP Temp Temp src Pulse Resp  08/28/24 1221 115/81 98.3 F (36.8 C) Oral 92 18    Physical Exam Vitals and nursing note reviewed. Exam conducted with a chaperone present.  Constitutional:      Appearance: She is well-developed.  HENT:     Head: Normocephalic and atraumatic.     Mouth/Throat:     Mouth: Mucous membranes are moist.  Eyes:     Extraocular Movements: Extraocular movements intact.  Cardiovascular:     Rate and Rhythm: Normal rate and regular rhythm.  Pulmonary:     Effort: Pulmonary effort is normal.  Abdominal:     Palpations: Abdomen is soft.     Tenderness: There is no abdominal tenderness.  Genitourinary:    Vagina: Vaginal discharge present.     Cervix: Normal.     Comments:  Cervix appears closed, significant cottage cheese discharge observed in the vagina.  Skin:    Capillary Refill: Capillary refill takes less than 2 seconds.  Neurological:     General: No focal deficit present.     Mental Status: She is alert.    Labs Results for orders placed or performed during the hospital encounter of 08/28/24 (from the past 24 hours)  Wet prep, genital     Status: Abnormal   Collection Time: 08/28/24 12:21 PM  Result Value Ref Range   Yeast Wet Prep HPF POC NONE SEEN NONE SEEN   Trich, Wet Prep NONE SEEN NONE SEEN   Clue Cells Wet Prep HPF POC NONE SEEN NONE SEEN   WBC, Wet Prep HPF POC >=10 (A) <10   Sperm NONE SEEN   Urinalysis, Routine w reflex microscopic -Urine, Clean Catch     Status: Abnormal   Collection Time: 08/28/24 12:21 PM  Result Value Ref Range   Color, Urine YELLOW YELLOW   APPearance CLEAR CLEAR   Specific Gravity, Urine 1.025 1.005 - 1.030   pH 6.0 5.0 - 8.0   Glucose, UA NEGATIVE NEGATIVE mg/dL   Hgb urine dipstick TRACE (A) NEGATIVE   Bilirubin Urine NEGATIVE NEGATIVE   Ketones, ur NEGATIVE NEGATIVE mg/dL   Protein, ur NEGATIVE NEGATIVE mg/dL   Nitrite NEGATIVE NEGATIVE   Leukocytes,Ua LARGE (A) NEGATIVE  Urinalysis, Microscopic (reflex)     Status: Abnormal   Collection Time: 08/28/24 12:21 PM  Result Value Ref Range   RBC / HPF 0-5 0 - 5 RBC/hpf   WBC, UA 0-5 0 - 5 WBC/hpf   Bacteria, UA RARE (A) NONE SEEN   Squamous Epithelial / HPF 0-5 0 - 5 /HPF   Mucus PRESENT     Imaging No results found.  MAU Course  Procedures Lab Orders         Wet prep, genital         Urinalysis, Routine w reflex microscopic -Urine, Clean Catch         Urinalysis, Microscopic (reflex)    Meds ordered this encounter  Medications   fluconazole  (DIFLUCAN ) tablet 150 mg   fluconazole  (DIFLUCAN ) 150 MG tablet    Sig: Take 1 tablet (150 mg total) by mouth daily.    Dispense:  1 tablet    Refill:  0   Imaging Orders         US  MFM FETAL BPP WO  NON STRESS         US  MFM OB  FOLLOW UP     MDM Moderate (Level 3-4)  Assessment and Plan  Non-reactive NST (non-stress test)  Uterine cramping  Vaginal candidiasis  [redacted] weeks gestation of pregnancy   Yolanda Payne is a 28 y.o. at [redacted]w[redacted]d by LMP, who presents for uterine cramping and discharge.  -Although wet prep negative, speculum exam consistent with yeast. Cervix appeared closed. -While being monitored, she did not have reactive NST so sent for BPP. BPP 6/8 with points off for movement. After she came back, NST reactive with BPP 8/10.    -Case discussed with Dr. Zina.  -Due to BPP, will have patient follow up tomorrow in MAU for repeat BPP. Discussed if observed any decreased fetal movement to come back to the MAU as soon as possible.  -Received one dose of diflucan  here in the MAU. Sent Rx for second dose to be taken in 72 hours.  -Stable for discharge home. -All questions answered, anticipatory guidance and detailed return precautions provided.        Yazen Rosko L Charina Fons, MD/MHA 08/28/2024 2:54 PM  Allergies as of 08/28/2024   No Known Allergies      Medication List     STOP taking these medications    cyclobenzaprine  10 MG tablet Commonly known as: FLEXERIL    metroNIDAZOLE  500 MG tablet Commonly known as: FLAGYL        TAKE these medications    Accu-Chek Guide Test test strip Generic drug: glucose blood 1 each by Other route 4 (four) times daily.   Accu-Chek Guide w/Device Kit 1 Device by Does not apply route as directed.   Accu-Chek Softclix Lancets lancets 1 each by Other route 4 (four) times daily.   aspirin  EC 81 MG tablet Take 1 tablet (81 mg total) by mouth daily. Take after 12 weeks for prevention of preeclampsia later in pregnancy   Blood Pressure Kit Devi 1 Device by Does not apply route once a week.   Dexcom G7 Sensor Misc Use one sensor every 10 days as instructed   fluconazole  150 MG tablet Commonly known as: Diflucan  Take  1 tablet (150 mg total) by mouth daily.   PrePLUS 27-1 MG Tabs Take 1 tablet by mouth daily.   valACYclovir  1000 MG tablet Commonly known as: Valtrex  Take 1 tablet (1,000 mg total) by mouth daily.           [1] No Known Allergies [2]  No current facility-administered medications on file prior to encounter.   Current Outpatient Medications on File Prior to Encounter  Medication Sig Dispense Refill   Prenatal Vit-Fe Fumarate-FA (PREPLUS) 27-1 MG TABS Take 1 tablet by mouth daily. 90 tablet 3   valACYclovir  (VALTREX ) 1000 MG tablet Take 1 tablet (1,000 mg total) by mouth daily. 90 tablet 3   Accu-Chek Softclix Lancets lancets 1 each by Other route 4 (four) times daily. 100 each 12   aspirin  EC 81 MG tablet Take 1 tablet (81 mg total) by mouth daily. Take after 12 weeks for prevention of preeclampsia later in pregnancy (Patient not taking: Reported on 08/18/2024) 300 tablet 2   Blood Glucose Monitoring Suppl (ACCU-CHEK GUIDE) w/Device KIT 1 Device by Does not apply route as directed. 1 kit 0   Blood Pressure Monitoring (BLOOD PRESSURE KIT) DEVI 1 Device by Does not apply route once a week. (Patient not taking: Reported on 08/18/2024) 1 each 0   Continuous Glucose Sensor (DEXCOM G7 SENSOR) MISC Use one sensor every 10 days as instructed 9 each 3   cyclobenzaprine  (  FLEXERIL ) 10 MG tablet Take 1 tablet (10 mg total) by mouth 2 (two) times daily as needed. (Patient not taking: Reported on 08/18/2024) 20 tablet 0   glucose blood (ACCU-CHEK GUIDE TEST) test strip 1 each by Other route 4 (four) times daily. 100 each 12   metroNIDAZOLE  (FLAGYL ) 500 MG tablet Take 500 mg by mouth 2 (two) times daily. (Patient not taking: Reported on 08/18/2024)     "

## 2024-08-28 NOTE — MAU Note (Signed)
 Yolanda Payne is a 28 y.o. at [redacted]w[redacted]d here in MAU reporting: was feeling crampy this morning. Went to work and the cramps increased around 11. States she feels like she is having ctxs that are every 3 mins. Denies any LOF or VB. Reports an increase in vaginal discharge, itching, vaginal swelling, and pain. States she was recently on amoxicillin  for a UTI. Tried monistat last night and had vaginal burning. Denies any recent sexual intercourse. Reports +FM, but less movement than usual.  Onset of complaint: ongoing, worse today  Pain score: 8 Vitals:   08/28/24 1221  BP: 115/81  Pulse: 92  Resp: 18  Temp: 98.3 F (36.8 C)     FHT:145 Lab orders placed from triage:

## 2024-08-29 ENCOUNTER — Inpatient Hospital Stay (HOSPITAL_COMMUNITY)
Admission: AD | Admit: 2024-08-29 | Discharge: 2024-08-29 | Disposition: A | Discharge status: Home / Self Care | Attending: Obstetrics and Gynecology | Admitting: Obstetrics and Gynecology

## 2024-08-29 ENCOUNTER — Other Ambulatory Visit: Payer: Self-pay

## 2024-08-29 ENCOUNTER — Other Ambulatory Visit (HOSPITAL_COMMUNITY): Attending: Obstetrics & Gynecology

## 2024-08-29 ENCOUNTER — Inpatient Hospital Stay (HOSPITAL_COMMUNITY)

## 2024-08-29 DIAGNOSIS — Z3689 Encounter for other specified antenatal screening: Secondary | ICD-10-CM | POA: Diagnosis not present

## 2024-08-29 DIAGNOSIS — O99213 Obesity complicating pregnancy, third trimester: Secondary | ICD-10-CM | POA: Insufficient documentation

## 2024-08-29 DIAGNOSIS — E669 Obesity, unspecified: Secondary | ICD-10-CM

## 2024-08-29 DIAGNOSIS — O24410 Gestational diabetes mellitus in pregnancy, diet controlled: Secondary | ICD-10-CM | POA: Diagnosis not present

## 2024-08-29 DIAGNOSIS — Z362 Encounter for other antenatal screening follow-up: Secondary | ICD-10-CM | POA: Diagnosis present

## 2024-08-29 DIAGNOSIS — Z3A35 35 weeks gestation of pregnancy: Secondary | ICD-10-CM

## 2024-08-29 DIAGNOSIS — Z3480 Encounter for supervision of other normal pregnancy, unspecified trimester: Secondary | ICD-10-CM

## 2024-08-29 DIAGNOSIS — O479 False labor, unspecified: Secondary | ICD-10-CM

## 2024-08-29 DIAGNOSIS — R8271 Bacteriuria: Secondary | ICD-10-CM

## 2024-08-29 DIAGNOSIS — O99212 Obesity complicating pregnancy, second trimester: Secondary | ICD-10-CM

## 2024-08-29 NOTE — MAU Provider Note (Signed)
 Chief Complaint  Patient presents with   Pregnancy Ultrasound    Repeat BPP      S: Yolanda Payne  is a 28 y.o. y.o. year old G69P0000 female at [redacted]w[redacted]d weeks gestation who presents to MAU r for repeat BPP after nonreactive NST and BPP 6/10 yesterday.  Was seen for cramping and vaginal discharge and nonreactive NST was noted incidentally.  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  Dilation: Closed Effacement (%): Thick Exam by:: Euline Kimbler  Claudene, CNM  NST performed EFM: 145, mod variability, 15x15 accels, no decels Toco: Q 4-7, mild  Dilation: Closed Effacement (%): Thick Exam by:: Loriene Taunton  Claudene, CNM    A: [redacted]w[redacted]d week IUP 1. Supervision of other normal pregnancy, antepartum   2. Pregnancy complicated by obesity, second trimester   3. GBS bacteriuria   4. NST (non-stress test) reactive   5. Braxton Hicks contractions   6. Diet controlled White classification A1 gestational diabetes mellitus (GDM)      P: Discharge home in stable condition. Preterm Labor precautions and fetal kick counts. Follow-up as scheduled for prenatal visit or sooner as needed if  symptoms worsen. Return to maternity admissions as needed if symptoms worsen.  Claudene, Ryeleigh Santore , CNM 08/29/2024 9:02 PM  2

## 2024-08-29 NOTE — MAU Note (Signed)
 Yolanda Payne is a 28 y.o. at [redacted]w[redacted]d here in MAU reporting: returning for repeat BPP from yesterday as directed. BPP from yesterday (12/19) was 6/8.  LMP: 12/28/23 Vitals:   08/29/24 1806  BP: 115/75  Pulse: 92  Resp: 17  Temp: 98.1 F (36.7 C)     FHT: 135bpm

## 2024-08-31 ENCOUNTER — Encounter: Admitting: Obstetrics and Gynecology

## 2024-08-31 LAB — GC/CHLAMYDIA PROBE AMP (~~LOC~~) NOT AT ARMC
Chlamydia: NEGATIVE
Comment: NEGATIVE
Comment: NORMAL
Neisseria Gonorrhea: NEGATIVE

## 2024-09-09 ENCOUNTER — Encounter: Payer: Self-pay | Admitting: Physician Assistant

## 2024-09-09 ENCOUNTER — Other Ambulatory Visit (HOSPITAL_COMMUNITY)
Admission: RE | Admit: 2024-09-09 | Discharge: 2024-09-09 | Disposition: A | Discharge status: Home / Self Care | Source: Ambulatory Visit

## 2024-09-09 ENCOUNTER — Ambulatory Visit: Admitting: Physician Assistant

## 2024-09-09 VITALS — BP 113/69 | HR 85 | Wt 230.6 lb

## 2024-09-09 DIAGNOSIS — B009 Herpesviral infection, unspecified: Secondary | ICD-10-CM | POA: Diagnosis not present

## 2024-09-09 DIAGNOSIS — Z3483 Encounter for supervision of other normal pregnancy, third trimester: Secondary | ICD-10-CM | POA: Diagnosis present

## 2024-09-09 DIAGNOSIS — O24410 Gestational diabetes mellitus in pregnancy, diet controlled: Secondary | ICD-10-CM

## 2024-09-09 DIAGNOSIS — Z3A36 36 weeks gestation of pregnancy: Secondary | ICD-10-CM

## 2024-09-09 DIAGNOSIS — R8271 Bacteriuria: Secondary | ICD-10-CM

## 2024-09-09 DIAGNOSIS — Z3480 Encounter for supervision of other normal pregnancy, unspecified trimester: Secondary | ICD-10-CM

## 2024-09-09 DIAGNOSIS — Z6837 Body mass index (BMI) 37.0-37.9, adult: Secondary | ICD-10-CM | POA: Diagnosis not present

## 2024-09-09 NOTE — Progress Notes (Signed)
 GDM; pt did not bring her log with her today.  Pt would like to discuss chances of induction due to GDM  Finger stick today @ 82

## 2024-09-09 NOTE — Progress Notes (Signed)
 "  PRENATAL VISIT NOTE  Subjective:  Yolanda Payne is a 28 y.o. G1P0000 at [redacted]w[redacted]d being seen today for ongoing prenatal care.  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  Patient reports no complaints.  Contractions: Irritability. Vag. Bleeding: None.  Movement: Present. Denies leaking of fluid.   The following portions of the patient's history were reviewed and updated as appropriate: allergies, current medications, past family history, past medical history, past social history, past surgical history and problem list.   Objective:   Vitals:   09/09/24 0849  BP: 113/69  Pulse: 85  Weight: 230 lb 9.6 oz (104.6 kg)    Fetal Status:  Fetal Heart Rate (bpm): 141 Fundal Height: 36 cm Movement: Present    General: Alert, oriented and cooperative. Patient is in no acute distress.  Skin: Skin is warm and dry. No rash noted.   Cardiovascular: Normal heart rate noted  Respiratory: Normal respiratory effort, no problems with respiration noted  Abdomen: Soft, gravid, appropriate for gestational age.  Pain/Pressure: Present     Pelvic: Cervical exam deferred        Extremities: Normal range of motion.  Edema: None  Mental Status: Normal mood and affect. Normal behavior. Normal judgment and thought content.      07/24/2024   10:00 AM 03/30/2024    9:32 AM 02/26/2024    1:30 PM  Depression screen PHQ 2/9  Decreased Interest 0 0 0  Down, Depressed, Hopeless 0 0 0  PHQ - 2 Score 0 0 0  Altered sleeping 0 1 0  Tired, decreased energy 1 2 0  Change in appetite 0 0 0  Feeling bad or failure about yourself  0 0 0  Trouble concentrating 0 0 0  Moving slowly or  fidgety/restless 0 0 0  Suicidal thoughts 0 0 0  PHQ-9 Score 1 3  0   Difficult doing work/chores  Not difficult at all      Data saved with a previous flowsheet row definition        07/24/2024   10:01 AM 03/30/2024    9:33 AM 02/26/2024    1:31 PM 02/18/2023    9:37 AM  GAD 7 : Generalized Anxiety Score  Nervous, Anxious, on Edge 3 0 0 1  Control/stop worrying 0 0 0 1  Worry too much - different things 0 0 0 1  Trouble relaxing 0 0 0 1  Restless 0 0 0 0  Easily annoyed or irritable 0 1 0 0  Afraid - awful might happen 0 0 0 0  Total GAD 7 Score 3 1 0 4  Anxiety Difficulty  Not difficult at all      Assessment and Plan:  Pregnancy: G1P0000 at [redacted]w[redacted]d  1. Supervision of other normal pregnancy, antepartum (Primary) Patient doing well, feeling regular fetal movement BP, FHR, FH appropriate   2. [redacted] weeks gestation of pregnancy Anticipatory guidance about next visits/weeks of pregnancy given.   3. Diet controlled gestational diabetes mellitus (GDM) in third trimester Level of control unknown. Patient has not been taking sugars since some time in November due to supplies being cost prohibitive. Fasting BG today is 82. Growth US  report from 12/19 is still in process of being completed.  12/19 BPP growth at 82%. Scheduling her induction for </=38 weeks. Discussed case and got delivery date recommendation from attending LB. I called patient's pharmacy to see if supplies were covered-they are not covered due to primary being medicare. Called patient and discussed sites to find coupons for accu-check supplies.   4. HSV-2 infection Continue valtrex  1,000 mg daily  5. GBS bacteriuria Intrapartum ppx  6. BMI 37.0-37.9, adult Continue ASA  Preterm labor symptoms and general obstetric precautions including but not limited to vaginal bleeding, contractions, leaking of fluid and fetal movement were reviewed in detail with the patient.  Please refer to After Visit Summary for other  counseling recommendations.   Return in about 1 week (around 09/16/2024) for [facility].  Future Appointments  Date Time Provider Department Center  09/16/2024  2:30 PM Hoyle Garre, NP [facility] None  09/20/2024  7:00 AM [facility] SCHED ROOM [facility] None  09/21/2024  9:15 AM [facility] PROVIDER 1 [facility]  09/21/2024  9:30 AM [facility] US1 [facility]    Jorene FORBES Moats, PA-C  "

## 2024-09-10 NOTE — L&D Delivery Note (Addendum)
 OB/GYN Faculty Practice Operative Delivery Note  Indication for operative vaginal delivery: NRFHT and maternal exhaustion  Patient was examined and found to be fully dilated with fetal station of +2.  Patient's bladder was noted to be empty, and there were no known fetal contraindications to operative vaginal delivery. EFW was 3300 by [place] and pelvis felt adquate FHR tracing remarkable for late decelerations with contractions that returned to baseline.   Risks of vacuum assistance were discussed in detail, including but not limited to, bleeding, infection, damage to maternal tissues, fetal cephalohematoma, inability to effect vaginal delivery of the head or shoulder dystocia that cannot be resolved by established maneuvers and need for emergency cesarean section.  Patient gave verbal consent.  The soft vacuum cup was positioned over the sagittal suture 3 cm anterior to posterior fontanelle.  Pressure was then increased to green zone and the patient was instructed to push.  Pulling was administered along the pelvic curve while patient was pushing; there were 3 contractions and 1 popoffs.  Vacuum was reduced in between contractions.  The infant was then delivered atraumatically, noted to be a viable female infant, Apgars of 6 and 9.  Neonatology present for delivery. Mild maternal dystocia with no impaction of the interior shoulder, requiring 45 seconds to deliver body after restitution. There was spontaneous placental delivery, intact with three-vessel cord.  Small bleeding first degree vaginal wall laceration repaired with single figure of 8 suture. EBL 250, epidural anesthesia.   Sponge, instrument and needle counts were correct x 2.  The patient and baby were stable after delivery and remained in couplet care, with plans to transfer later to postpartum unit  Weight 2970  Arterial: pH 7.25/CO2 47/bicarb 20.6/A-B deficit 6.7 Venous: pH 7.3/CO2 43/bicarb 21.2/A-B deficit 5.1  Colter LITTIE Angles,  MD FMOB Fellow, Faculty practice [facility], Center for [facility] 09/22/2024  12:34 AM   Attestation of Attending Supervision of Fellow: Evaluation and management procedures were performed by the fellow under my supervision.  I have seen and examined the patient,  reviewed the fellow's note and chart, and I agree with the management and plan.   Bebe Izell Raddle MD Attending Center for [employer] [facility])

## 2024-09-11 LAB — CERVICOVAGINAL ANCILLARY ONLY
Bacterial Vaginitis (gardnerella): NEGATIVE
Candida Glabrata: NEGATIVE
Candida Vaginitis: POSITIVE — AB
Chlamydia: NEGATIVE
Comment: NEGATIVE
Comment: NEGATIVE
Comment: NEGATIVE
Comment: NEGATIVE
Comment: NEGATIVE
Comment: NORMAL
Neisseria Gonorrhea: NEGATIVE
Trichomonas: NEGATIVE

## 2024-09-14 ENCOUNTER — Telehealth: Payer: Self-pay | Admitting: Student

## 2024-09-14 MED ORDER — FLUCONAZOLE 150 MG PO TABS: 150.0000 mg | ORAL_TABLET | Freq: Once | ORAL | 0 refills | Status: AC

## 2024-09-14 NOTE — Telephone Encounter (Signed)
 Patient called requesting Rx for her yeast, which she states is getting worse.  Rx routed to pharmacy per protocol .

## 2024-09-15 ENCOUNTER — Telehealth (HOSPITAL_COMMUNITY): Payer: Self-pay | Admitting: *Deleted

## 2024-09-15 ENCOUNTER — Encounter (HOSPITAL_COMMUNITY): Payer: Self-pay | Admitting: *Deleted

## 2024-09-15 NOTE — Telephone Encounter (Signed)
 Preadmission screen

## 2024-09-16 ENCOUNTER — Ambulatory Visit: Payer: Self-pay | Admitting: Student

## 2024-09-16 VITALS — BP 119/73 | HR 88 | Wt 236.1 lb

## 2024-09-16 DIAGNOSIS — O24410 Gestational diabetes mellitus in pregnancy, diet controlled: Secondary | ICD-10-CM | POA: Diagnosis not present

## 2024-09-16 DIAGNOSIS — B3731 Acute candidiasis of vulva and vagina: Secondary | ICD-10-CM | POA: Diagnosis not present

## 2024-09-16 DIAGNOSIS — B009 Herpesviral infection, unspecified: Secondary | ICD-10-CM | POA: Diagnosis not present

## 2024-09-16 DIAGNOSIS — Z6837 Body mass index (BMI) 37.0-37.9, adult: Secondary | ICD-10-CM

## 2024-09-16 DIAGNOSIS — R8271 Bacteriuria: Secondary | ICD-10-CM | POA: Diagnosis not present

## 2024-09-16 DIAGNOSIS — Z3A37 37 weeks gestation of pregnancy: Secondary | ICD-10-CM | POA: Diagnosis not present

## 2024-09-16 DIAGNOSIS — N760 Acute vaginitis: Secondary | ICD-10-CM

## 2024-09-16 DIAGNOSIS — Z3480 Encounter for supervision of other normal pregnancy, unspecified trimester: Secondary | ICD-10-CM

## 2024-09-16 MED ORDER — METRONIDAZOLE 500 MG PO TABS: 500.0000 mg | ORAL_TABLET | Freq: Two times a day (BID) | ORAL | 0 refills | Status: DC

## 2024-09-16 NOTE — Progress Notes (Signed)
 Pt presents for rob. No questions or concerns at this time.

## 2024-09-16 NOTE — Progress Notes (Signed)
 "  PRENATAL VISIT NOTE  Subjective:  Yolanda Payne is a 29 y.o. G1P0000 at [redacted]w[redacted]d being seen today for ongoing prenatal care.  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  Patient reports no complaints.  Contractions: Irritability. Vag. Bleeding: None.  Movement: Present. Denies leaking of fluid.   The following portions of the patient's history were reviewed and updated as appropriate: allergies, current medications, past family history, past medical history, past social history, past surgical history and problem list.   Objective:   Vitals:   09/16/24 1450  BP: 119/73  Pulse: 88  Weight: 236 lb 1.6 oz (107.1 kg)    Fetal Status:  Fetal Heart Rate (bpm): 150   Movement: Present    General: Alert, oriented and cooperative. Patient is in no acute distress.  Skin: Skin is warm and dry. No rash noted.   Cardiovascular: Normal heart rate noted  Respiratory: Normal respiratory effort, no problems with respiration noted  Abdomen: Soft, gravid, appropriate for gestational age.  Pain/Pressure: Present     Pelvic: Cervical exam deferred        Extremities: Normal range of motion.  Edema: Trace  Mental Status: Normal mood and affect. Normal behavior. Normal judgment and thought content.      07/24/2024   10:00 AM 03/30/2024    9:32 AM 02/26/2024    1:30 PM  Depression screen PHQ 2/9  Decreased Interest 0 0 0  Down, Depressed, Hopeless 0 0 0  PHQ - 2 Score 0 0 0  Altered sleeping 0 1 0  Tired, decreased energy 1 2 0  Change in appetite 0 0 0  Feeling bad or failure about yourself  0 0 0  Trouble concentrating 0 0 0  Moving slowly or fidgety/restless 0 0  0  Suicidal thoughts 0 0 0  PHQ-9 Score 1 3  0   Difficult doing work/chores  Not difficult at all      Data saved with a previous flowsheet row definition        07/24/2024   10:01 AM 03/30/2024    9:33 AM 02/26/2024    1:31 PM 02/18/2023    9:37 AM  GAD 7 : Generalized Anxiety Score  Nervous, Anxious, on Edge 3 0 0 1  Control/stop worrying 0 0 0 1  Worry too much - different things 0 0 0 1  Trouble relaxing 0 0 0 1  Restless 0 0 0 0  Easily annoyed or irritable 0 1 0 0  Afraid - awful might happen 0 0 0 0  Total GAD 7 Score 3 1 0 4  Anxiety Difficulty  Not difficult at all      Assessment and Plan:  Pregnancy: G1P0000 at [redacted]w[redacted]d 1. Supervision of other normal pregnancy, antepartum (Primary) - Frequent and vigorous fetal movement - FHT WDL  2. [redacted] weeks gestation of pregnancy - Anticipatory guidance provided for IOL on 01/11  3. Diet controlled gestational diabetes mellitus (GDM) in third trimester - IOL scheduled 1/11 - Level of control unknown. Patient has not been taking sugars since some time in November due to supplies being cost prohibitive.  4. GBS bacteriuria - Intrapartum abx  5. HSV-2 infection - continue valtrex   6. BMI 37.0-37.9, adult -  Continue ASA  Term labor symptoms and general obstetric precautions including but not limited to vaginal bleeding, contractions, leaking of fluid and fetal movement were reviewed in detail with the patient. Please refer to After Visit Summary for other counseling recommendations.   No follow-ups on file.  Future Appointments  Date Time Provider Department Center  09/20/2024  7:00 AM [facility] SCHED ROOM [facility] None  09/21/2024  9:15 AM [facility] PROVIDER 1 [facility]  09/21/2024  9:30 AM [facility] US1 [facility]    Nat Dauer, NP  "

## 2024-09-18 MED ORDER — MICONAZOLE NITRATE 2 % VA CREA: 1.0000 | TOPICAL_CREAM | Freq: Every day | VAGINAL | 2 refills | Status: DC

## 2024-09-20 ENCOUNTER — Inpatient Hospital Stay (HOSPITAL_COMMUNITY): Admission: RE | Admit: 2024-09-20

## 2024-09-21 ENCOUNTER — Ambulatory Visit

## 2024-09-21 ENCOUNTER — Other Ambulatory Visit: Payer: Self-pay

## 2024-09-21 ENCOUNTER — Inpatient Hospital Stay (HOSPITAL_COMMUNITY)
Admission: RE | Admit: 2024-09-21 | Discharge: 2024-09-23 | DRG: 806 | Disposition: A | Discharge status: Home / Self Care | Attending: Obstetrics and Gynecology | Admitting: Obstetrics and Gynecology

## 2024-09-21 ENCOUNTER — Encounter: Admitting: Obstetrics

## 2024-09-21 ENCOUNTER — Inpatient Hospital Stay (HOSPITAL_COMMUNITY): Admitting: Anesthesiology

## 2024-09-21 ENCOUNTER — Encounter (HOSPITAL_COMMUNITY): Payer: Self-pay | Admitting: Obstetrics and Gynecology

## 2024-09-21 DIAGNOSIS — E66813 Obesity, class 3: Secondary | ICD-10-CM | POA: Diagnosis present

## 2024-09-21 DIAGNOSIS — O99824 Streptococcus B carrier state complicating childbirth: Secondary | ICD-10-CM | POA: Diagnosis present

## 2024-09-21 DIAGNOSIS — O9832 Other infections with a predominantly sexual mode of transmission complicating childbirth: Secondary | ICD-10-CM | POA: Diagnosis present

## 2024-09-21 DIAGNOSIS — R8271 Bacteriuria: Secondary | ICD-10-CM

## 2024-09-21 DIAGNOSIS — Z3A38 38 weeks gestation of pregnancy: Secondary | ICD-10-CM | POA: Diagnosis not present

## 2024-09-21 DIAGNOSIS — A6000 Herpesviral infection of urogenital system, unspecified: Secondary | ICD-10-CM | POA: Diagnosis present

## 2024-09-21 DIAGNOSIS — O24420 Gestational diabetes mellitus in childbirth, diet controlled: Principal | ICD-10-CM | POA: Diagnosis present

## 2024-09-21 DIAGNOSIS — O99212 Obesity complicating pregnancy, second trimester: Secondary | ICD-10-CM

## 2024-09-21 DIAGNOSIS — O99214 Obesity complicating childbirth: Secondary | ICD-10-CM | POA: Diagnosis present

## 2024-09-21 DIAGNOSIS — Z3490 Encounter for supervision of normal pregnancy, unspecified, unspecified trimester: Principal | ICD-10-CM | POA: Diagnosis present

## 2024-09-21 DIAGNOSIS — Z3480 Encounter for supervision of other normal pregnancy, unspecified trimester: Secondary | ICD-10-CM

## 2024-09-21 DIAGNOSIS — Z8249 Family history of ischemic heart disease and other diseases of the circulatory system: Secondary | ICD-10-CM

## 2024-09-21 LAB — CBC
HCT: 40.8 % (ref 36.0–46.0)
Hemoglobin: 13.8 g/dL (ref 12.0–15.0)
MCH: 30.2 pg (ref 26.0–34.0)
MCHC: 33.8 g/dL (ref 30.0–36.0)
MCV: 89.3 fL (ref 80.0–100.0)
Platelets: 217 K/uL (ref 150–400)
RBC: 4.57 MIL/uL (ref 3.87–5.11)
RDW: 13 % (ref 11.5–15.5)
WBC: 10.6 K/uL — ABNORMAL HIGH (ref 4.0–10.5)
nRBC: 0 % (ref 0.0–0.2)

## 2024-09-21 LAB — GLUCOSE, CAPILLARY
Glucose-Capillary: 101 mg/dL — ABNORMAL HIGH (ref 70–99)
Glucose-Capillary: 124 mg/dL — ABNORMAL HIGH (ref 70–99)
Glucose-Capillary: 139 mg/dL — ABNORMAL HIGH (ref 70–99)
Glucose-Capillary: 75 mg/dL (ref 70–99)
Glucose-Capillary: 78 mg/dL (ref 70–99)
Glucose-Capillary: 90 mg/dL (ref 70–99)

## 2024-09-21 LAB — SYPHILIS: RPR W/REFLEX TO RPR TITER AND TREPONEMAL ANTIBODIES, TRADITIONAL SCREENING AND DIAGNOSIS ALGORITHM: RPR Ser Ql: NONREACTIVE

## 2024-09-21 LAB — TYPE AND SCREEN
ABO/RH(D): O POS
Antibody Screen: NEGATIVE

## 2024-09-21 MED ORDER — ACETAMINOPHEN 325 MG PO TABS: 650.0000 mg | ORAL_TABLET | ORAL | Status: DC | PRN

## 2024-09-21 MED ORDER — SODIUM CHLORIDE 0.9 % IV SOLN
5.0000 10*6.[IU] | Freq: Once | INTRAVENOUS | Status: AC
  Administered 2024-09-21: 5 10*6.[IU] via INTRAVENOUS
  Filled 2024-09-21: qty 5

## 2024-09-21 MED ORDER — PHENYLEPHRINE 80 MCG/ML (10ML) SYRINGE FOR IV PUSH (FOR BLOOD PRESSURE SUPPORT): 80.0000 ug | PREFILLED_SYRINGE | INTRAVENOUS | Status: DC | PRN

## 2024-09-21 MED ORDER — OXYTOCIN-SODIUM CHLORIDE 30-0.9 UT/500ML-% IV SOLN
2.5000 [IU]/h | INTRAVENOUS | Status: DC
  Filled 2024-09-21: qty 500

## 2024-09-21 MED ORDER — FAMOTIDINE IN NACL 20-0.9 MG/50ML-% IV SOLN
20.0000 mg | Freq: Once | INTRAVENOUS | Status: AC
  Administered 2024-09-21: 20 mg via INTRAVENOUS
  Filled 2024-09-21: qty 50

## 2024-09-21 MED ORDER — FENTANYL CITRATE (PF) 100 MCG/2ML IJ SOLN
INTRAMUSCULAR | Status: AC
  Administered 2024-09-21: 100 ug via EPIDURAL
  Filled 2024-09-21: qty 2

## 2024-09-21 MED ORDER — PENICILLIN G POT IN DEXTROSE 60000 UNIT/ML IV SOLN
3.0000 10*6.[IU] | INTRAVENOUS | Status: DC
  Administered 2024-09-21 (×3): 3 10*6.[IU] via INTRAVENOUS
  Filled 2024-09-21 (×4): qty 50

## 2024-09-21 MED ORDER — LIDOCAINE HCL (PF) 1 % IJ SOLN
INTRAMUSCULAR | Status: DC | PRN
  Administered 2024-09-21 (×2): 5 mL via EPIDURAL

## 2024-09-21 MED ORDER — FENTANYL CITRATE (PF) 100 MCG/2ML IJ SOLN
INTRAMUSCULAR | Status: AC
  Filled 2024-09-21: qty 2

## 2024-09-21 MED ORDER — BUPIVACAINE HCL (PF) 0.25 % IJ SOLN
INTRAMUSCULAR | Status: DC | PRN
  Administered 2024-09-21: 8 mL via EPIDURAL

## 2024-09-21 MED ORDER — OXYTOCIN BOLUS FROM INFUSION
333.0000 mL | Freq: Once | INTRAVENOUS | Status: AC
  Administered 2024-09-22: 333 mL via INTRAVENOUS

## 2024-09-21 MED ORDER — LIDOCAINE HCL (PF) 1 % IJ SOLN: 30.0000 mL | INTRAMUSCULAR | Status: DC | PRN

## 2024-09-21 MED ORDER — MISOPROSTOL 25 MCG QUARTER TABLET
25.0000 ug | ORAL_TABLET | Freq: Once | ORAL | Status: AC
  Administered 2024-09-21: 25 ug via VAGINAL
  Filled 2024-09-21: qty 1

## 2024-09-21 MED ORDER — TERBUTALINE SULFATE 1 MG/ML IJ SOLN: 0.2500 mg | Freq: Once | INTRAMUSCULAR | Status: DC | PRN

## 2024-09-21 MED ORDER — OXYTOCIN-SODIUM CHLORIDE 30-0.9 UT/500ML-% IV SOLN
1.0000 m[IU]/min | INTRAVENOUS | Status: DC
  Administered 2024-09-21: 2 m[IU]/min via INTRAVENOUS

## 2024-09-21 MED ORDER — MISOPROSTOL 50MCG HALF TABLET
50.0000 ug | ORAL_TABLET | Freq: Once | ORAL | Status: AC
  Administered 2024-09-21: 50 ug via ORAL
  Filled 2024-09-21: qty 1

## 2024-09-21 MED ORDER — OXYCODONE-ACETAMINOPHEN 5-325 MG PO TABS: 2.0000 | ORAL_TABLET | ORAL | Status: DC | PRN

## 2024-09-21 MED ORDER — METOCLOPRAMIDE HCL 5 MG/ML IJ SOLN
10.0000 mg | Freq: Once | INTRAMUSCULAR | Status: AC
  Administered 2024-09-21: 10 mg via INTRAVENOUS
  Filled 2024-09-21: qty 2

## 2024-09-21 MED ORDER — OXYCODONE-ACETAMINOPHEN 5-325 MG PO TABS: 1.0000 | ORAL_TABLET | ORAL | Status: DC | PRN

## 2024-09-21 MED ORDER — PHENYLEPHRINE 80 MCG/ML (10ML) SYRINGE FOR IV PUSH (FOR BLOOD PRESSURE SUPPORT)
80.0000 ug | PREFILLED_SYRINGE | INTRAVENOUS | Status: DC | PRN
  Filled 2024-09-21: qty 10

## 2024-09-21 MED ORDER — LACTATED RINGERS IV SOLN: INTRAVENOUS | Status: DC

## 2024-09-21 MED ORDER — SOD CITRATE-CITRIC ACID 500-334 MG/5ML PO SOLN: 30.0000 mL | ORAL | Status: DC | PRN

## 2024-09-21 MED ORDER — VALACYCLOVIR HCL 500 MG PO TABS: 1000.0000 mg | ORAL_TABLET | Freq: Every day | ORAL | Status: DC

## 2024-09-21 MED ORDER — LACTATED RINGERS IV SOLN
500.0000 mL | Freq: Once | INTRAVENOUS | Status: AC
  Administered 2024-09-21: 500 mL via INTRAVENOUS

## 2024-09-21 MED ORDER — VALACYCLOVIR HCL 500 MG PO TABS
500.0000 mg | ORAL_TABLET | Freq: Two times a day (BID) | ORAL | Status: DC
  Administered 2024-09-21 (×2): 500 mg via ORAL
  Filled 2024-09-21 (×2): qty 1

## 2024-09-21 MED ORDER — FENTANYL-BUPIVACAINE-NACL 0.5-0.125-0.9 MG/250ML-% EP SOLN
12.0000 mL/h | EPIDURAL | Status: DC | PRN
  Administered 2024-09-21: 12 mL/h via EPIDURAL
  Filled 2024-09-21: qty 250

## 2024-09-21 MED ORDER — EPHEDRINE 5 MG/ML INJ
10.0000 mg | INTRAVENOUS | Status: DC | PRN
  Administered 2024-09-21: 10 mg via INTRAVENOUS

## 2024-09-21 MED ORDER — FENTANYL CITRATE (PF) 100 MCG/2ML IJ SOLN
INTRAMUSCULAR | Status: DC | PRN
  Administered 2024-09-21: 100 ug via EPIDURAL

## 2024-09-21 MED ORDER — ONDANSETRON HCL 4 MG/2ML IJ SOLN
4.0000 mg | Freq: Once | INTRAMUSCULAR | Status: AC
  Administered 2024-09-21: 4 mg via INTRAVENOUS
  Filled 2024-09-21: qty 2

## 2024-09-21 MED ORDER — ONDANSETRON HCL 4 MG/2ML IJ SOLN
4.0000 mg | Freq: Four times a day (QID) | INTRAMUSCULAR | Status: DC | PRN
  Administered 2024-09-21 – 2024-09-22 (×2): 4 mg via INTRAVENOUS
  Filled 2024-09-21 (×2): qty 2

## 2024-09-21 MED ORDER — DIPHENHYDRAMINE HCL 50 MG/ML IJ SOLN: 12.5000 mg | INTRAMUSCULAR | Status: DC | PRN

## 2024-09-21 MED ORDER — LACTATED RINGERS IV SOLN
500.0000 mL | INTRAVENOUS | Status: DC | PRN
  Administered 2024-09-21: 500 mL via INTRAVENOUS

## 2024-09-21 MED ORDER — EPHEDRINE 5 MG/ML INJ
10.0000 mg | INTRAVENOUS | Status: DC | PRN
  Filled 2024-09-21: qty 5

## 2024-09-21 NOTE — Progress Notes (Signed)
 Dr. Magali notified pt has report cold for 2 weeks, nasal congestion, sore throat, cough. Bilateral lower lung sound are mildly coarse. SpO2 98% RA. No new orders. Continue to monitor.

## 2024-09-21 NOTE — Progress Notes (Signed)
 L&D Note  09/21/2024 - 11:03 PM  29 y.o. G1P0000 [redacted]w[redacted]d. 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 Lizann Edelman is admitted for IOL for GDM   Subjective:  Feels UCs but not painful  Objective:    Current Vital Signs 24h Vital Sign Ranges  T 98.8 F (37.1 C) Temp  Avg: 98.6 F (37 C)  Min: 97.9 F (36.6 C)  Max: 99.2 F (37.3 C)  BP 114/76 BP  Min: 102/61  Max: 147/81  HR 94 Pulse  Avg: 87.1  Min: 70  Max: 115  RR 18 Resp  Avg: 18  Min: 18  Max: 18  SaO2 99 % Room Air SpO2  Avg: 99 %  Min: 98 %  Max: 100 %       24 Hour I/O Current Shift I/O  Time Ins Outs No intake/output data recorded. 01/12 1901 - 01/13 0700 In: -  Out: 300 [Urine:300]   FHR: 165 baseline, no accels, early decels, mod variability Toco: q3-74m UCs Gen: NAD SVE: pushes to +4, feels DOA, pelvis feels adequate, efw 3300g  Labs:  Recent Labs  Lab 09/21/24 0043  WBC 10.6*  HGB 13.8  HCT 40.8  PLT 217   1801 CBG 75  Medications Current Facility-Administered Medications  Medication Dose Route Frequency Provider Last Rate Last Admin   acetaminophen  (TYLENOL ) tablet 650 mg  650 mg Oral Q4H PRN Manon Jester, DO       diphenhydrAMINE  (BENADRYL ) injection 12.5 mg  12.5 mg Intravenous Q15 min PRN Jefm Garnette LABOR, MD       ePHEDrine  injection 10 mg  10 mg Intravenous PRN Jefm Garnette LABOR, MD   10 mg at 09/21/24 1920   ePHEDrine  injection 10 mg  10 mg Intravenous PRN Jefm Garnette LABOR, MD       fentaNYL  2 mcg/mL w/ bupivacaine  0.125% in NS 250 mL epidural infusion  12 mL/hr  Epidural Continuous PRN Jefm Garnette LABOR, MD 12 mL/hr at 09/21/24 1241 12 mL/hr at 09/21/24 1241   lactated ringers  infusion 500-1,000 mL  500-1,000 mL Intravenous PRN Manon Jester, DO 999 mL/hr at 09/21/24 1503 Rate Change at 09/21/24 1503   lactated ringers  infusion   Intravenous Continuous Manon Jester, DO 125 mL/hr at 09/21/24 0133 New Bag at 09/21/24 0133   lidocaine  (PF) (XYLOCAINE ) 1 % injection 30 mL  30 mL Subcutaneous PRN Manon Jester, DO       ondansetron  (ZOFRAN ) injection 4 mg  4 mg Intravenous Q6H PRN Manon Jester, DO   4 mg at 09/21/24 1615   oxyCODONE -acetaminophen  (PERCOCET/ROXICET) 5-325 MG per tablet 1 tablet  1 tablet Oral Q4H PRN Manon Jester, DO       oxyCODONE -acetaminophen  (PERCOCET/ROXICET) 5-325 MG per tablet 2 tablet  2 tablet Oral Q4H PRN Manon Jester, DO       oxytocin  (PITOCIN ) IV BOLUS FROM BAG  333 mL Intravenous Once Yanuck, [place], DO       Followed by   oxytocin  (PITOCIN ) IV infusion 30 units in NS 500 mL - Premix  2.5 Units/hr Intravenous Continuous Manon Jester, DO  oxytocin  (PITOCIN ) IV infusion 30 units in NS 500 mL - Premix  1-40 milli-units/min Intravenous Titrated Cashion, Colter L, MD 2 mL/hr at 09/21/24 2100 2 milli-units/min at 09/21/24 2100   penicillin  G potassium 3 Million Units in dextrose  50mL IVPB  3 Million Units Intravenous Q4H Manon Jester, DO 100 mL/hr at 09/21/24 2203 3 Million Units at 09/21/24 2203   PHENYLephrine  80 mcg/ml in normal saline Adult IV Push Syringe (For Blood Pressure Support)  80-160 mcg Intravenous PRN Houser, Stephen A, MD       PHENYLephrine  80 mcg/ml in normal saline Adult IV Push Syringe (For Blood Pressure Support)  80 mcg Intravenous PRN Houser, Stephen A, MD       sodium citrate-citric acid  (ORACIT) solution 30 mL  30 mL Oral Q2H PRN Manon Jester, DO       terbutaline  (BRETHINE ) injection 0.25 mg  0.25 mg Subcutaneous Once PRN Yanuck, [place], DO       terbutaline  (BRETHINE ) injection  0.25 mg  0.25 mg Subcutaneous Once PRN Cashion, Colter L, MD       valACYclovir  (VALTREX ) tablet 500 mg  500 mg Oral BID Hur, Marisa, DO   500 mg at 09/21/24 2200   Facility-Administered Medications Ordered in Other Encounters  Medication Dose Route Frequency Provider Last Rate Last Admin   bupivacaine  (PF) (MARCAINE ) 0.25 % injection   Epidural Anesthesia Intra-op Finucane, Elizabeth M, DO   8 mL at 09/21/24 1905   fentaNYL  (SUBLIMAZE ) injection   Epidural Anesthesia Intra-op Finucane, Elizabeth M, DO   100 mcg at 09/21/24 1905   lidocaine  (PF) (XYLOCAINE ) 1 % injection   Epidural Anesthesia Intra-op Jefm Garnette LABOR, MD   5 mL at 09/21/24 1455    Assessment & Plan:  Patient doing well *Labor: I was initially called in due to prolonged decel with pushing and OVD with vacuum offered but patient declined. EFM improved and pt pushing well.  *GDM: no current issues *GBS: +. S/p PCN doses *Analgesia: epidural working well.  Bebe Izell Overcast MD Attending Center for [facility])

## 2024-09-21 NOTE — Progress Notes (Signed)
 LABOR PROGRESS NOTE Pt rechecked at 1730. Probably SROM since last visit. She has received epidural x2 and a bolus. Patient tearfully uncomfortable despite epidural, described pressure in her butt.  SCE: 7/90/-2 to -1  FHT: baseline 140, mod variability, +accels, early decels; overall category I Toco: q3 min  A/P:  Continue spontaneous labor and position changes   Barabara Maier, DO 5:32 PM

## 2024-09-21 NOTE — Anesthesia Procedure Notes (Signed)
 Epidural Patient location during procedure: OB Start time: 09/21/2024 12:25 PM End time: 09/21/2024 12:42 PM  Staffing Anesthesiologist: Jefm Garnette LABOR, MD Performed: anesthesiologist   Preanesthetic Checklist Completed: patient identified, IV checked, site marked, risks and benefits discussed, surgical consent, monitors and equipment checked, pre-op evaluation and timeout performed  Epidural Patient position: sitting Prep: DuraPrep and site prepped and draped Patient monitoring: continuous pulse ox and blood pressure Approach: midline Location: L2-L3 Injection technique: LOR air  Needle:  Needle type: Tuohy  Needle gauge: 17 G Needle length: 9 cm and 9 Needle insertion depth: 8 cm Catheter type: closed end flexible Catheter size: 19 Gauge Catheter at skin depth: 15 cm Test dose: negative  Assessment Events: blood not aspirated, no cerebrospinal fluid, injection not painful, no injection resistance, no paresthesia and negative IV test  Additional Notes Patient identified. Risks/Benefits/Options discussed with patient including but not limited to bleeding, infection, nerve damage, paralysis, failed block, incomplete pain control, headache, blood pressure changes, nausea, vomiting, reactions to medication both or allergic, itching and postpartum back pain. Confirmed with bedside nurse the patient's most recent platelet count. Confirmed with patient that they are not currently taking any anticoagulation, have any bleeding history or any family history of bleeding disorders. Patient expressed understanding and wished to proceed. All questions were answered. Sterile technique was used throughout the entire procedure. Please see nursing notes for vital signs. Test dose was given through epidural needle and negative prior to continuing to dose epidural or start infusion. Warning signs of high block given to the patient including shortness of breath, tingling/numbness in hands, complete  motor block, or any concerning symptoms with instructions to call for help. Patient was given instructions on fall risk and not to get out of bed. All questions and concerns addressed with instructions to call with any issues.  1 Attempt (S) . Patient tolerated procedure well.

## 2024-09-21 NOTE — Anesthesia Preprocedure Evaluation (Signed)
 "                                  Anesthesia Evaluation  Patient identified by MRN, date of birth, ID band Patient awake    Reviewed: Allergy & Precautions, Patient's Chart, lab work & pertinent test results  Airway Mallampati: II  TM Distance: >3 FB Neck ROM: Full    Dental no notable dental hx. (+) Teeth Intact, Dental Advisory Given   Pulmonary neg pulmonary ROS   Pulmonary exam normal breath sounds clear to auscultation       Cardiovascular negative cardio ROS Normal cardiovascular exam Rhythm:Regular Rate:Normal     Neuro/Psych negative neurological ROS  negative psych ROS   GI/Hepatic negative GI ROS, Neg liver ROS,,,  Endo/Other  diabetes, Gestational  Class 3 obesity  Renal/GU negative Renal ROS     Musculoskeletal negative musculoskeletal ROS (+)    Abdominal   Peds  Hematology Lab Results      Component                Value               Date                      WBC                      10.6 (H)            09/21/2024                HGB                      13.8                09/21/2024                HCT                      40.8                09/21/2024                MCV                      89.3                09/21/2024                PLT                      217                 09/21/2024              Anesthesia Other Findings   Reproductive/Obstetrics (+) Pregnancy                              Anesthesia Physical Anesthesia Plan  ASA: 3  Anesthesia Plan: Epidural   Post-op Pain Management:    Induction:   PONV Risk Score and Plan:   Airway Management Planned:   Additional Equipment:   Intra-op Plan:   Post-operative Plan:   Informed Consent: I have reviewed the patients History and Physical, chart, labs and discussed the procedure including the  risks, benefits and alternatives for the proposed anesthesia with the patient or authorized representative who has indicated his/her  understanding and acceptance.       Plan Discussed with:   Anesthesia Plan Comments: (38.2 wk primagravida w gDm and Bmi of 42 for LEA)        Anesthesia Quick Evaluation  "

## 2024-09-21 NOTE — H&P (Cosign Needed Addendum)
 OBSTETRIC ADMISSION HISTORY AND PHYSICAL  Yolanda Payne is a 29 y.o. female G1P0000 with IUP at [redacted]w[redacted]d by LMP presenting for IOL 2/2 GDM. She reports +FMs, No LOF, no VB, no blurry vision, headaches or peripheral edema, and RUQ pain.  She plans on formula feeding. She is undecided on type of birth control at the moment. She received her [facility] at [facility]   Dating: By LMP --->  Estimated Date of Delivery: 10/03/24  [place]:    @[redacted]w[redacted]d , CWD, normal anatomy, cephalic presentation, anterior placenta, 291g, 29% EFW  F/u US  at [redacted]w[redacted]d showed 1455gm, EFW 56%  Prenatal History/Complications: HSV, GDMA1, GBS Positive  Past Medical History: Past Medical History:  Diagnosis Date   Bacterial vaginosis 02/18/2023   Gestational diabetes    HSV (herpes simplex virus) infection     Past Surgical History: Past Surgical History:  Procedure Laterality Date   WISDOM TOOTH EXTRACTION      Obstetrical History: OB History     Gravida  1   Para  0   Term  0   Preterm  0   AB  0   Living  0      SAB  0   IAB  0   Ectopic  0   Multiple  0   Live Births  0           Social History Social History   Socioeconomic History   Marital status: Single    Spouse name: Not on file   Number of children: Not on file   Years of education: Not on file   Highest education level: Not on file  Occupational History   Not on file  Tobacco Use   Smoking status: Never   Smokeless tobacco: Never  Vaping Use   Vaping status: Never Used  Substance and Sexual Activity   Alcohol use: Not Currently   Drug use: No   Sexual activity: Not Currently    Partners: Male    Birth control/protection: None  Other Topics Concern   Not on file  Social History Narrative   Not on file   Social Drivers of Health   Tobacco Use: Low Risk (09/21/2024)   Patient History    Smoking Tobacco Use: Never    Smokeless Tobacco Use: Never    Passive Exposure: Not on file  Financial Resource Strain:  Low Risk (08/13/2022)   Overall Financial Resource Strain (CARDIA)    Difficulty of Paying Living Expenses: Not hard at all  Food Insecurity: No Food Insecurity (09/21/2024)   Epic    Worried About Programme Researcher, Broadcasting/film/video in the Last Year: Never true    Ran Out of Food in the Last Year: Never true  Transportation Needs: No Transportation Needs (09/21/2024)   Epic    Lack of Transportation (Medical): No    Lack of Transportation (Non-Medical): No  Physical Activity: Inactive (08/13/2022)   Exercise Vital Sign    Days of Exercise per Week: 0 days    Minutes of Exercise per Session: 0 min  Stress: No Stress Concern Present (08/13/2022)   [employer] of Occupational Health - Occupational Stress Questionnaire    Feeling of Stress : Not at all  Social Connections: Not on file  Depression (PHQ2-9): Low Risk (07/24/2024)   Depression (PHQ2-9)    PHQ-2 Score: 1  Alcohol Screen: Not on file  Housing: Low Risk (09/21/2024)   Epic    Unable to Pay for Housing in the Last Year: No  Number of Times Moved in the Last Year: 0    Homeless in the Last Year: No  Utilities: Not At Risk (09/21/2024)   Epic    Threatened with loss of utilities: No  Health Literacy: Not on file    Family History: Family History  Problem Relation Age of Onset   Healthy Mother    Healthy Father    Hypertension Father     Allergies: Allergies[1]  Medications Prior to Admission  Medication Sig Dispense Refill Last Dose/Taking   Prenatal Vit-Fe Fumarate-FA (PREPLUS) 27-1 MG TABS Take 1 tablet by mouth daily. 90 tablet 3 09/20/2024 at  5:00 AM   valACYclovir  (VALTREX ) 1000 MG tablet Take 1 tablet (1,000 mg total) by mouth daily. 90 tablet 3 09/20/2024 at  5:00 AM   Accu-Chek Softclix Lancets lancets 1 each by Other route 4 (four) times daily. (Patient not taking: Reported on 09/16/2024) 100 each 12    aspirin  EC 81 MG tablet Take 1 tablet (81 mg total) by mouth daily. Take after 12 weeks for prevention of preeclampsia  later in pregnancy (Patient not taking: Reported on 09/16/2024) 300 tablet 2    Blood Glucose Monitoring Suppl (ACCU-CHEK GUIDE) w/Device KIT 1 Device by Does not apply route as directed. (Patient not taking: Reported on 09/16/2024) 1 kit 0    Blood Pressure Monitoring (BLOOD PRESSURE KIT) DEVI 1 Device by Does not apply route once a week. (Patient not taking: Reported on 09/16/2024) 1 each 0    Continuous Glucose Sensor (DEXCOM G7 SENSOR) MISC Use one sensor every 10 days as instructed (Patient not taking: Reported on 09/16/2024) 9 each 3    fluconazole  (DIFLUCAN ) 150 MG tablet Take 1 tablet (150 mg total) by mouth daily. (Patient not taking: Reported on 09/16/2024) 1 tablet 0    glucose blood (ACCU-CHEK GUIDE TEST) test strip 1 each by Other route 4 (four) times daily. (Patient not taking: Reported on 09/16/2024) 100 each 12    miconazole  (MONISTAT  7) 2 % vaginal cream Place 1 Applicatorful vaginally at bedtime. Apply for seven nights 30 g 2      Review of Systems   All systems reviewed and negative except as stated in HPI  Height 5' 3 (1.6 m), weight 108 kg, last menstrual period 12/28/2023. General appearance: alert, cooperative, and no distress Lungs: clear to auscultation bilaterally Heart: regular rate and rhythm Abdomen: soft, non-tender; bowel sounds normal Extremities: Homans sign is negative, no sign of DVT Presentation: cephalic Fetal monitoringBaseline: 145 bpm, Variability: Good {> 6 bpm), Accelerations: Reactive, and Decelerations: Absent Uterine activityFrequency: Every 8-10 minutes Dilation: 1 Effacement (%): 50 Station: -3 Exam by:: Dr. Magali   Prenatal labs: ABO, Rh: --/--/PENDING (01/12 0130) Antibody: PENDING (01/12 0130) Rubella: 5.86 (07/21 1008) RPR: Non Reactive (11/14 0815)  HBsAg: Negative (07/21 1008)  HIV: Non Reactive (11/14 0815)  GBS:     No results found for: GBS GTT A1GDM Genetic screening  LR female, horizon negative, AFP negative Anatomy US   WNL  Immunization History  Administered Date(s) Administered   Influenza-Unspecified 06/10/2022   PFIZER(Purple Top)SARS-COV-2 Vaccination 03/21/2020, 04/11/2020, 10/30/2020   Tdap 07/24/2024    Prenatal Transfer Tool  Maternal Diabetes: Yes:  Diabetes Type:  Diet controlled Genetic Screening: Normal Maternal Ultrasounds/Referrals: Normal Fetal Ultrasounds or other Referrals:  None Maternal Substance Abuse:  No Significant Maternal Medications:  Meds include: Other: Valtrex  Significant Maternal Lab Results: Group B Strep positive Number of Prenatal Visits:greater than 3 verified prenatal visits Maternal Vaccinations:TDap Other Comments:  None   Results for orders placed or performed during the hospital encounter of 09/21/24 (from the past 24 hours)  CBC   Collection Time: 09/21/24 12:43 AM  Result Value Ref Range   WBC 10.6 (H) 4.0 - 10.5 K/uL   RBC 4.57 3.87 - 5.11 MIL/uL   Hemoglobin 13.8 12.0 - 15.0 g/dL   HCT 59.1 63.9 - 53.9 %   MCV 89.3 80.0 - 100.0 fL   MCH 30.2 26.0 - 34.0 pg   MCHC 33.8 30.0 - 36.0 g/dL   RDW 86.9 88.4 - 84.4 %   Platelets 217 150 - 400 K/uL   nRBC 0.0 0.0 - 0.2 %  Type and screen [facility]   Collection Time: 09/21/24  1:30 AM  Result Value Ref Range   ABO/RH(D) PENDING    Antibody Screen PENDING    Sample Expiration      09/24/2024,2359 Performed at [facility], 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 G1P0000 at [redacted]w[redacted]d  here for IOL 2/2 GDM  #Labor: G1, will start induction w/ dual cytotec  50 oral 25 vaginal.  #Pain: Plans to get epidural #FWB: Cat 1 tracing w/ baseline 145, moderate variability and no decels present. Sparse uterine activity.  #Feeding: Formula #Reproductive Life planning: Nexplanon and Undecided #Circ:  not applicable #HSV - no lesions appreciated on speculum exam #GDM - q4h CBGs during latent labor, q2h during active labor GBS positive - start Pen G upon ROM, active labor or beginning pitocin , followed by Pen G 3MU q4h.   Leafy Scriver, DO  09/21/2024, 2:09 AM   Attestation of Supervision of Student:  I confirm that I have verified the information documented in the resident's note and that I have also personally performed the history, physical exam and all medical decision making activities.  I have verified that all services and findings are accurately documented in this student's note; and I agree with management and plan as outlined in the documentation. I have also made any necessary editorial changes.   Barkley LITTIE Angles, MD OB Fellow 09/21/2024 2:15 AM     [1] No Known Allergies

## 2024-09-21 NOTE — Progress Notes (Signed)
 LABOR PROGRESS NOTE Pt rechecked at 1130 Patient comfortable sitting on labor ball with partner support. SCE: 3/90/-2  FHT: baseline 150, mod variability, +accels, -decels; overall category I Toco: q2 min  A/P:  AROM, she would like to go to the bathroom first and get comfortable in the bed again first  [employer], DO 5:30 PM

## 2024-09-22 ENCOUNTER — Encounter (HOSPITAL_COMMUNITY): Payer: Self-pay | Admitting: Obstetrics and Gynecology

## 2024-09-22 LAB — GLUCOSE, CAPILLARY: Glucose-Capillary: 158 mg/dL — ABNORMAL HIGH (ref 70–99)

## 2024-09-22 MED ORDER — TETANUS-DIPHTH-ACELL PERTUSSIS 5-2-15.5 LF-MCG/0.5 IM SUSP: 0.5000 mL | Freq: Once | INTRAMUSCULAR | Status: DC

## 2024-09-22 MED ORDER — MEASLES, MUMPS & RUBELLA VAC ~~LOC~~ SUSR: 0.5000 mL | Freq: Once | SUBCUTANEOUS | Status: DC

## 2024-09-22 MED ORDER — SIMETHICONE 80 MG PO CHEW: 80.0000 mg | CHEWABLE_TABLET | ORAL | Status: DC | PRN

## 2024-09-22 MED ORDER — DIBUCAINE (PERIANAL) 1 % EX OINT: 1.0000 | TOPICAL_OINTMENT | CUTANEOUS | Status: DC | PRN

## 2024-09-22 MED ORDER — SODIUM CHLORIDE 0.9% FLUSH: 3.0000 mL | INTRAVENOUS | Status: DC | PRN

## 2024-09-22 MED ORDER — ONDANSETRON HCL 4 MG/2ML IJ SOLN: 4.0000 mg | INTRAMUSCULAR | Status: DC | PRN

## 2024-09-22 MED ORDER — SODIUM CHLORIDE 0.9 % IV SOLN: 250.0000 mL | INTRAVENOUS | Status: DC | PRN

## 2024-09-22 MED ORDER — BENZOCAINE-MENTHOL 20-0.5 % EX AERO: 1.0000 | INHALATION_SPRAY | CUTANEOUS | Status: DC | PRN

## 2024-09-22 MED ORDER — ACETAMINOPHEN 325 MG PO TABS: 650.0000 mg | ORAL_TABLET | ORAL | Status: DC | PRN

## 2024-09-22 MED ORDER — PRENATAL MULTIVITAMIN CH
1.0000 | ORAL_TABLET | Freq: Every day | ORAL | Status: DC
  Administered 2024-09-22 – 2024-09-23 (×2): 1 via ORAL
  Filled 2024-09-22 (×2): qty 1

## 2024-09-22 MED ORDER — SODIUM CHLORIDE 0.9% FLUSH
3.0000 mL | Freq: Two times a day (BID) | INTRAVENOUS | Status: DC
  Administered 2024-09-22: 3 mL via INTRAVENOUS

## 2024-09-22 MED ORDER — ONDANSETRON HCL 4 MG PO TABS: 4.0000 mg | ORAL_TABLET | ORAL | Status: DC | PRN

## 2024-09-22 MED ORDER — TRANEXAMIC ACID-NACL 1000-0.7 MG/100ML-% IV SOLN: 1000.0000 mg | INTRAVENOUS | Status: DC

## 2024-09-22 MED ORDER — DIPHENHYDRAMINE HCL 25 MG PO CAPS: 25.0000 mg | ORAL_CAPSULE | Freq: Four times a day (QID) | ORAL | Status: DC | PRN

## 2024-09-22 MED ORDER — IBUPROFEN 800 MG PO TABS
800.0000 mg | ORAL_TABLET | Freq: Three times a day (TID) | ORAL | Status: DC
  Administered 2024-09-22 – 2024-09-23 (×5): 800 mg via ORAL
  Filled 2024-09-22 (×5): qty 1

## 2024-09-22 MED ORDER — OXYCODONE HCL 5 MG PO TABS: 5.0000 mg | ORAL_TABLET | Freq: Four times a day (QID) | ORAL | Status: DC | PRN

## 2024-09-22 MED ORDER — SENNOSIDES-DOCUSATE SODIUM 8.6-50 MG PO TABS
2.0000 | ORAL_TABLET | ORAL | Status: DC
  Administered 2024-09-22 – 2024-09-23 (×2): 2 via ORAL
  Filled 2024-09-22 (×2): qty 2

## 2024-09-22 MED ORDER — WITCH HAZEL-GLYCERIN EX PADS: 1.0000 | MEDICATED_PAD | CUTANEOUS | Status: DC | PRN

## 2024-09-22 MED ORDER — COCONUT OIL OIL: 1.0000 | TOPICAL_OIL | Status: DC | PRN

## 2024-09-22 MED ORDER — TRANEXAMIC ACID-NACL 1000-0.7 MG/100ML-% IV SOLN
INTRAVENOUS | Status: AC
  Administered 2024-09-22: 1000 mg
  Filled 2024-09-22: qty 100

## 2024-09-22 NOTE — Discharge Summary (Shared)
 "    Postpartum Discharge Summary  Date of Service updated***     Patient Name: Yolanda Payne DOB: 01-28-1996 MRN: [id number]  Date of admission: 09/21/2024 Delivery date:09/22/2024 Delivering provider:   Date of discharge: 09/22/2024  Admitting diagnosis: Encounter for induction of labor [Z34.90] Intrauterine pregnancy: [redacted]w[redacted]d     Secondary diagnosis:  Principal Problem:   Encounter for induction of labor  Additional problems: ***    Discharge diagnosis: {DX.:23714}                                              Post partum procedures:{Postpartum procedures:23558} Augmentation: Pitocin  and Cytotec  Complications: None  Hospital course: Induction of Labor With Vaginal Delivery   29 y.o. yo G1P0000 at [redacted]w[redacted]d was admitted to the hospital 09/21/2024 for induction of labor.  Indication for induction: A1 DM.  Patient had an labor course complicated byN/A Membrane Rupture Time/Date: 2:20 PM,09/21/2024  Delivery Method:Vaginal, Vacuum (Extractor) Operative Delivery:Device used:Kiwi Indication: Maternal exhaustion and Fetal indications Episiotomy: None Lacerations:  1st degree;Vaginal Details of delivery can be found in separate delivery note.  Patient had a postpartum course complicated by***. Patient is discharged home 09/22/2024.  Newborn Data: Birth date:09/22/2024 Birth time:12:02 AM Gender:Female Living status:Living Apgars: ,9  Weight:   Magnesium Sulfate received: {Mag received:30440022} BMZ received: No Rhophylac:N/A MMR:N/A T-DaP:Given prenatally Flu: Declined prenatally, offered PP RSV Vaccine received: No Transfusion:{Transfusion received:30440034}  Immunizations received: Immunization History  Administered Date(s) Administered   Influenza-Unspecified 06/10/2022   PFIZER(Purple Top)SARS-COV-2 Vaccination 03/21/2020, 04/11/2020, 10/30/2020   Tdap 07/24/2024    Physical exam  Vitals:   09/21/24 2100 09/21/24 2130 09/21/24 2200 09/21/24 2332  BP: 115/70 119/78  114/76   Pulse: 81 93 94   Resp:      Temp:  97.9 F (36.6 C)  97.7 F (36.5 C)  TempSrc:  Oral  Oral  SpO2:      Weight:      Height:       General: {Exam; general:21111117} Lochia: {Desc; appropriate/inappropriate:30686::appropriate} Uterine Fundus: {Desc; firm/soft:30687} Incision: {Exam; incision:21111123} DVT Evaluation: {Exam; dvt:2111122} Labs: Lab Results  Component Value Date   WBC 10.6 (H) 09/21/2024   HGB 13.8 09/21/2024   HCT 40.8 09/21/2024   MCV 89.3 09/21/2024   PLT 217 09/21/2024      Latest Ref Rng & Units 07/24/2024    8:15 AM  CMP  Glucose 70 - 99 mg/dL 90   BUN 6 - 20 mg/dL 10   Creatinine 9.42 - 1.00 mg/dL 9.22   Sodium 865 - 855 mmol/L 138   Potassium 3.5 - 5.2 mmol/L 4.5   Chloride 96 - 106 mmol/L 103   CO2 20 - 29 mmol/L 18   Calcium 8.7 - 10.2 mg/dL 9.6   Total Protein 6.0 - 8.5 g/dL 6.7   Total Bilirubin 0.0 - 1.2 mg/dL 0.2   Alkaline Phos 41 - 116 IU/L 78   AST 0 - 40 IU/L 22   ALT 0 - 32 IU/L 12    Edinburgh Score:     No data to display         No data recorded  After visit meds:  Allergies as of 09/22/2024   No Known Allergies   Med Rec must be completed prior to using this [facility]***        Discharge home in stable condition Infant Feeding: {Baby feeding:23562}  Infant Disposition:{CHL IP OB HOME WITH FNUYZM:76418} Discharge instruction: per After Visit Summary and Postpartum booklet. Activity: Advance as tolerated. Pelvic rest for 6 weeks.  Diet: {OB ipzu:78888878} Future Appointments: Future Appointments  Date Time Provider Department Center  10/28/2024  9:35 AM Wallace Joesph LABOR, PA [facility] None   Follow up Visit:   Please schedule this patient for a In person postpartum visit in 6 weeks with the following provider: Any provider. Additional Postpartum F/U:2 hour GTT  High risk pregnancy complicated by: GDM Delivery mode:  Vaginal, Vacuum [employer]er, Operational) Anticipated Birth Control:   Unsure   09/22/2024 Daleysa Kristiansen LITTIE Angles, MD    "

## 2024-09-22 NOTE — Anesthesia Postprocedure Evaluation (Signed)
"   Anesthesia Post Note  Patient: Yolanda Payne  Procedure(s) Performed: AN AD HOC LABOR EPIDURAL     Patient location during evaluation: Mother Baby Anesthesia Type: Epidural Level of consciousness: awake, oriented and awake and alert Pain management: pain level controlled Vital Signs Assessment: post-procedure vital signs reviewed and stable Respiratory status: spontaneous breathing, respiratory function stable and nonlabored ventilation Cardiovascular status: stable Postop Assessment: no headache, adequate PO intake, able to ambulate, patient able to bend at knees and no apparent nausea or vomiting Anesthetic complications: no   No notable events documented.  Last Vitals:  Vitals:   09/22/24 0300 09/22/24 0444  BP: (!) 112/53 111/67  Pulse: 72 78  Resp: 18 18  Temp: 36.8 C   SpO2: 98% 98%    Last Pain:  Vitals:   09/22/24 0444  TempSrc:   PainSc: 4    Pain Goal:                   Yolanda Payne      "

## 2024-09-22 NOTE — Care Management Important Message (Signed)
 Important Message  Patient Details  Name: Anara Cowman MRN: [id number] Date of Birth: June 30, 1996   Important Message Given:  Yes - Medicare IM     Vonzell Arrie Sharps 09/22/2024, 7:59 AM

## 2024-09-23 MED ORDER — IBUPROFEN 800 MG PO TABS: 800.0000 mg | ORAL_TABLET | Freq: Three times a day (TID) | ORAL | 0 refills | Status: AC

## 2024-09-23 MED ORDER — NORGESTIMATE-ETH ESTRADIOL 0.25-35 MG-MCG PO TABS: 1.0000 | ORAL_TABLET | Freq: Every day | ORAL | 11 refills | Status: AC

## 2024-09-23 MED ORDER — SENNOSIDES-DOCUSATE SODIUM 8.6-50 MG PO TABS: 2.0000 | ORAL_TABLET | ORAL | 0 refills | Status: AC

## 2024-09-23 MED ORDER — ACETAMINOPHEN 325 MG PO TABS: 650.0000 mg | ORAL_TABLET | ORAL | Status: AC | PRN

## 2024-09-23 NOTE — Discharge Summary (Addendum)
 "    Postpartum Discharge Summary  Patient Name: Yolanda Payne DOB: 08/22/1996 MRN: [id number]  Date of admission: 09/21/2024 Delivery date:09/22/2024 Delivering provider: Hopelynn Gartland L Date of discharge: 09/23/2024  Admitting diagnosis: Encounter for induction of labor [Z34.90] Intrauterine pregnancy: [redacted]w[redacted]d     Secondary diagnosis:  Principal Problem:   Encounter for induction of labor  Additional problems: None     Discharge diagnosis: Term Pregnancy Delivered and GDM A1                                              Post partum procedures:None Augmentation: Pitocin  and Cytotec  Complications: None  Hospital course: Induction of Labor With Vaginal Delivery   29 y.o. yo G1P1001 at [redacted]w[redacted]d was admitted to the hospital 09/21/2024 for induction of labor.  Indication for induction: A1 DM.  Patient had an labor course complicated byN/A Membrane Rupture Time/Date: 2:20 PM,09/21/2024  Delivery Method:Vaginal, Vacuum (Extractor) Operative Delivery:Device used:Kiwi Indication: Maternal exhaustion and Fetal indications Episiotomy: None Lacerations:  1st degree;Vaginal Details of delivery can be found in separate delivery note. Uncomplicated post-partum course. Patient is discharged home 09/23/2024.  Newborn Data: Birth date:09/22/2024 Birth time:12:02 AM Gender:Female Living status:Living Apgars:6 ,9  Weight:2970 g  Magnesium Sulfate received: No BMZ received: No Rhophylac:N/A MMR:N/A T-DaP:Given prenatally Flu: Declined prenatally, offered PP RSV Vaccine received: No Transfusion:No  Immunizations received: Immunization History  Administered Date(s) Administered   Influenza-Unspecified 06/10/2022   PFIZER(Purple Top)SARS-COV-2 Vaccination 03/21/2020, 04/11/2020, 10/30/2020   Tdap 07/24/2024    Physical exam  Vitals:   09/22/24 0848 09/22/24 1251 09/22/24 2049 09/23/24 0555  BP: 120/71 122/80 (!) 110/57 113/64  Pulse: 71 84 68 60  Resp: 16 16 16 16   Temp: 98 F (36.7  C) 98.5 F (36.9 C) 98.2 F (36.8 C) 97.7 F (36.5 C)  TempSrc: Oral Oral Oral Oral  SpO2:    99%  Weight:      Height:       General: alert, cooperative, and no distress Lochia: appropriate Uterine Fundus: firm Incision: N/A DVT Evaluation: No evidence of DVT seen on physical exam. Labs: Lab Results  Component Value Date   WBC 10.6 (H) 09/21/2024   HGB 13.8 09/21/2024   HCT 40.8 09/21/2024   MCV 89.3 09/21/2024   PLT 217 09/21/2024      Latest Ref Rng & Units 07/24/2024    8:15 AM  CMP  Glucose 70 - 99 mg/dL 90   BUN 6 - 20 mg/dL 10   Creatinine 9.42 - 1.00 mg/dL 9.22   Sodium 865 - 855 mmol/L 138   Potassium 3.5 - 5.2 mmol/L 4.5   Chloride 96 - 106 mmol/L 103   CO2 20 - 29 mmol/L 18   Calcium 8.7 - 10.2 mg/dL 9.6   Total Protein 6.0 - 8.5 g/dL 6.7   Total Bilirubin 0.0 - 1.2 mg/dL 0.2   Alkaline Phos 41 - 116 IU/L 78   AST 0 - 40 IU/L 22   ALT 0 - 32 IU/L 12    Edinburgh Score:    09/22/2024    8:48 AM  Edinburgh Postnatal Depression Scale Screening Tool  I have been able to laugh and see the funny side of things. 0  I have looked forward with enjoyment to things. 0  I have blamed myself unnecessarily when things went wrong. 2  I have been anxious  or worried for no good reason. 0  I have felt scared or panicky for no good reason. 0  Things have been getting on top of me. 0  I have been so unhappy that I have had difficulty sleeping. 0  I have felt sad or miserable. 0  I have been so unhappy that I have been crying. 1  The thought of harming myself has occurred to me. 0  Edinburgh Postnatal Depression Scale Total 3   Edinburgh Postnatal Depression Scale Total: 3   After visit meds:  Allergies as of 09/23/2024   No Known Allergies      Medication List     STOP taking these medications    Accu-Chek Guide Test test strip Generic drug: glucose blood   Accu-Chek Guide w/Device Kit   Accu-Chek Softclix Lancets lancets   aspirin  EC 81 MG  tablet   Blood Pressure Kit Devi   Dexcom G7 Sensor Misc   fluconazole  150 MG tablet Commonly known as: Diflucan    miconazole  2 % vaginal cream Commonly known as: MONISTAT  7   valACYclovir  1000 MG tablet Commonly known as: Valtrex        TAKE these medications    acetaminophen  325 MG tablet Commonly known as: Tylenol  Take 2 tablets (650 mg total) by mouth every 4 (four) hours as needed (for pain scale < 4).   ibuprofen  800 MG tablet Commonly known as: ADVIL  Take 1 tablet (800 mg total) by mouth every 8 (eight) hours.   norgestimate -ethinyl estradiol  0.25-35 MG-MCG tablet Commonly known as: Sprintec 28 Take 1 tablet by mouth daily.   PrePLUS 27-1 MG Tabs Take 1 tablet by mouth daily.   senna-docusate 8.6-50 MG tablet Commonly known as: Senokot-S Take 2 tablets by mouth daily.         Discharge home in stable condition Infant Feeding: Bottle Infant Disposition:home with mother Discharge instruction: per After Visit Summary and Postpartum booklet. Activity: Advance as tolerated. Pelvic rest for 6 weeks.  Diet: routine diet Future Appointments: Future Appointments  Date Time Provider Department Center  10/28/2024  9:35 AM Wallace Joesph LABOR, PA [facility] None   Follow up Visit:  Follow-up Information     [facility]. Go in 6 week(s).   Specialty: Obstetrics and Gynecology Contact information: [address] [place]  [address] [phone]                 Please schedule this patient for a In person postpartum visit in 6 weeks with the following provider: Any provider. Additional Postpartum F/U:2 hour GTT  High risk pregnancy complicated by: GDM Delivery mode:  Vaginal, Vacuum [employer], Operational) Anticipated Birth Control:  Unsure   09/23/2024 Takeria Marquina LITTIE Angles, MD    "

## 2024-09-23 NOTE — Patient Instructions (Signed)

## 2024-09-27 ENCOUNTER — Ambulatory Visit (HOSPITAL_COMMUNITY): Payer: Self-pay | Admitting: Physician Assistant

## 2024-09-28 ENCOUNTER — Telehealth: Payer: Self-pay

## 2024-09-28 MED ORDER — FLUCONAZOLE 150 MG PO TABS: 150.0000 mg | ORAL_TABLET | Freq: Once | ORAL | 0 refills | Status: AC

## 2024-09-28 NOTE — Telephone Encounter (Signed)
 Patient contacted requesting appt with lactation services.  She would like to initiate breastfeeding, as she was not able to after delivery. Currently infant is formula only. Pt started feeling breast milk come in yesterday and was placing cabbage leaves. Discussed not to use cabbage leaves or ice packs on breast as this will stop milk supply. Advised she will be contacted separately to schedule with lactation group.

## 2024-10-02 ENCOUNTER — Telehealth (HOSPITAL_COMMUNITY): Payer: Self-pay | Admitting: *Deleted

## 2024-10-02 NOTE — Telephone Encounter (Signed)
 10/02/2024  Name: Yolanda Payne MRN: [id number] DOB: September 08, 1996  Reason for Call:  Transition of Care Hospital Discharge Call  Contact Status: Patient Contact Status: Message  Language assistant needed:          Follow-Up Questions:    Van Postnatal Depression Scale:  In the Past 7 Days:    PHQ2-9 Depression Scale:     Discharge Follow-up:    Post-discharge interventions: NA  Mliss Sieve, RN 10/02/2024 13:07

## 2024-10-28 ENCOUNTER — Ambulatory Visit: Payer: Self-pay | Admitting: Family Medicine
